# Patient Record
Sex: Male | Born: 1980 | Race: Black or African American | Marital: Single | Smoking: Former smoker
Health system: Northeastern US, Academic
[De-identification: ages and names within clinical notes are randomized; demographics above are authoritative.]

## PROBLEM LIST (undated history)

## (undated) DIAGNOSIS — K529 Noninfective gastroenteritis and colitis, unspecified: Secondary | ICD-10-CM

## (undated) DIAGNOSIS — I10 Essential (primary) hypertension: Secondary | ICD-10-CM

## (undated) HISTORY — PX: OTHER SURGICAL HISTORY: SHX169

## (undated) HISTORY — PX: SHOULDER SURGERY: SHX246

---

## 2010-10-24 ENCOUNTER — Encounter: Payer: Self-pay | Admitting: Emergency Medicine

## 2010-10-24 ENCOUNTER — Emergency Department
Admission: EM | Admit: 2010-10-24 | Disposition: A | Discharge status: Home / Self Care | Payer: Self-pay | Source: Ambulatory Visit

## 2010-10-24 LAB — CBC AND DIFFERENTIAL
Baso # K/uL: 0.1 THOU/uL (ref 0.0–0.1)
Basophil %: 0.5 % (ref 0.2–1.2)
Eos # K/uL: 0.1 THOU/uL (ref 0.0–0.5)
Eosinophil %: 1.1 % (ref 0.8–7.0)
Hematocrit: 45 % (ref 40–51)
Hemoglobin: 15.1 g/dL (ref 13.7–17.5)
Lymph # K/uL: 2.5 THOU/uL (ref 1.3–3.6)
Lymphocyte %: 22.9 % (ref 21.8–53.1)
MCV: 86 fL (ref 79–92)
Mono # K/uL: 0.8 THOU/uL (ref 0.3–0.8)
Monocyte %: 7.1 % (ref 5.3–12.2)
Neut # K/uL: 7.5 THOU/uL — ABNORMAL HIGH (ref 1.8–5.4)
Platelets: 337 THOU/uL — ABNORMAL HIGH (ref 150–330)
RBC: 5.2 MIL/uL (ref 4.6–6.1)
RDW: 13.4 % (ref 11.6–14.4)
Seg Neut %: 68.4 % — ABNORMAL HIGH (ref 34.0–67.9)
WBC: 11 THOU/uL — ABNORMAL HIGH (ref 4.2–9.1)

## 2010-10-24 LAB — POCT URINALYSIS DIPSTICK
Blood,UA: NEGATIVE
Exp date: 2012
Glucose,UA: NORMAL
Ketones, UA: NEGATIVE
Leuk Esterase,UA: NEGATIVE
Lot #: 20735101
Nitrite,UA POCT: NEGATIVE
PH,Ur: 5
Protein,UA: NEGATIVE
Specific gravity,UA POCT: 1.02 (ref 1.002–1.030)

## 2010-10-24 LAB — COMPREHENSIVE METABOLIC PANEL
ALT: 31 U/L (ref 0–50)
AST: 26 U/L (ref 0–50)
Albumin: 4.7 g/dL (ref 3.5–5.2)
Alk Phos: 100 U/L (ref 40–130)
Anion Gap: 11 (ref 7–16)
Bilirubin,Total: 0.3 mg/dL (ref 0.0–1.2)
CO2: 27 mmol/L (ref 20–28)
Calcium: 9.4 mg/dL (ref 9.0–10.3)
Chloride: 102 mmol/L (ref 96–108)
Creatinine: 1.11 mg/dL (ref 0.67–1.17)
GFR,Black: >59 *
GFR,Caucasian: >59 *
Glucose: 88 mg/dL (ref 74–106)
Lab: 12 mg/dL (ref 6–20)
Potassium: 4.4 mmol/L (ref 3.3–5.1)
Sodium: 140 mmol/L (ref 133–145)
Total Protein: 7.6 g/dL (ref 6.3–7.7)

## 2010-10-24 MED ORDER — IBUPROFEN 800 MG PO TABS *I*
800.0000 mg | ORAL_TABLET | Freq: Three times a day (TID) | ORAL | Status: AC | PRN
Start: 2010-10-24 — End: 2010-11-03

## 2010-10-24 MED ORDER — KETOROLAC TROMETHAMINE 30 MG/ML IJ SOLN *I*
30.0000 mg | Freq: Once | INTRAMUSCULAR | Status: AC
Start: 2010-10-24 — End: 2010-10-24
  Administered 2010-10-24: 30 mg via INTRAVENOUS
  Filled 2010-10-24: qty 1

## 2010-10-24 NOTE — ED Notes (Signed)
 NOTES ABD. PAIN FOR TWO DAYS.DENIES N/V/D.

## 2010-10-24 NOTE — ED Notes (Signed)
 Pt c/o left abd. Pain x 3 days, denies vomiting. Last BM today and normal.

## 2010-10-24 NOTE — Discharge Instructions (Signed)
Call the medicine clinic and set up an appointment to be seen if your symptoms worsen or to establish a primary care physician.    Return to the emergency department for any worsening symptoms, increased pain, repeated nausea/vomiting, fever/chills, blood in your stool, or with any other concerns.

## 2010-10-24 NOTE — ED Provider Notes (Addendum)
 History   Chief Complaint   Patient presents with   . Abdominal Pain       HPI Comments: 29yo male p/w left sided abdominal pain. Pain started about 2-3 days ago. He remembers doing some heavy lifting at work and thinks the pain started after this. Pain is worse in the morning (8/10 at worst) and worse with movement. Denies nausea/vomiting/fever/chills. He had a normal BM this am with no blood. Pain is non-radiating. Pt. Has had no anorexia- he presented to the ED with boxes of take out from [facility]. At home he has taken tylenol without relief.     Patient is a 29 y.o. male presenting with abdominal pain. The history is provided by the patient.   Abdominal Pain  The primary symptoms of the illness include abdominal pain. The primary symptoms of the illness do not include fever, shortness of breath, nausea, vomiting or diarrhea. The current episode started 2 days ago. The onset of the illness was sudden. The problem has not changed since onset.   The patient has not had a change in bowel habit. Symptoms associated with the illness do not include chills, anorexia (has [employer] take out with him), constipation, urgency, hematuria, frequency or back pain.       History reviewed.  No pertinent past medical history.    Past Surgical History   Procedure Date   . Shoulder surgery      left   . Arm surgery      right         Family History   Problem Relation Age of Onset   . Cancer Mother      breast           reports that he quit smoking about 2 years ago. He does not have any smokeless tobacco history on file.  He reports that he drinks about .5 ounces of alcohol per week.  He reports that he does not currently use illicit drugs.    Review of Systems   Review of Systems   Constitutional: Negative for fever, chills, activity change and appetite change.   HENT: Negative for congestion and neck pain.    Eyes: Negative for photophobia and visual disturbance.   Respiratory: Negative for cough, shortness of breath and  wheezing.    Cardiovascular: Negative for chest pain.   Gastrointestinal: Positive for abdominal pain. Negative for nausea, vomiting, diarrhea, constipation, abdominal distention and anorexia (has [employer] take out with him).   Genitourinary: Negative for urgency, frequency, hematuria and difficulty urinating.   Musculoskeletal: Negative for back pain and gait problem.   Skin: Negative for rash.   Neurological: Negative for dizziness, weakness, light-headedness and headaches.   Psychiatric/Behavioral: Negative for behavioral problems and agitation. The patient is not nervous/anxious.        Physical Exam   BP 133/72  Pulse 67  Temp(Src) 36.1 C (97 F) (Tympanic)  Resp 18  Ht 1.651 m (5\' 5" )  Wt 80.287 kg (177 lb)  BMI 29.45 kg/m2  SpO2 98%    Physical Exam   Constitutional: He is oriented to person, place, and time. He appears well-developed and well-nourished.   HENT:   Head: Normocephalic and atraumatic.   Eyes: Pupils are equal, round, and reactive to light.   Neck: Normal range of motion. Neck supple.   Cardiovascular: Normal rate, normal heart sounds and intact distal pulses.    No murmur heard.  Pulmonary/Chest: Effort normal and breath sounds normal. No respiratory distress.  Abdominal: Soft. Bowel sounds are normal. He exhibits distension (softly). He exhibits no mass. Tenderness is present. He has no rigidity, no rebound, no guarding, no CVA tenderness, no pain at McBurney's point and no Murphy's sign. No hernia.         Musculoskeletal: Normal range of motion.   Neurological: He is alert and oriented to person, place, and time.   Skin: Skin is warm and dry.   Psychiatric: He has a normal mood and affect.       Medical Decision Making   MDM  Number of Diagnoses or Management Options  Diagnosis management comments: Patient seen by me today, 10/24/2010 at the time of arrival 11:10 AM    Assessment:  29 y.o., male comes to the ED with left sided abdominal pain without N/V/D/F/C.  Differential  Diagnosis includes strained muscle, abdominal pain, diverticulitis, kidney stone  Plan: check labs, toradol IV (pain control), check UA    Supervising physician Dr. Evaristo Bury was immediately available.           Amount and/or Complexity of Data Reviewed  Clinical lab tests: ordered          Azucena Fallen, NP    Addendum:    Recent Results (from the past 24 hour(s))   CBC AND DIFFERENTIAL    Collection Time    10/24/10  2:58 PM   Component Value Range   . WBC 11.0 (*) 4.2-9.1 (THOU/uL)   . RBC 5.2  4.6-6.1 (MIL/uL)   . Hemoglobin 15.1  13.7-17.5 (g/dL)   . Hematocrit 45  40-51 (%)   . MCV 86  79-92 (fL)   . RDW 13.4  11.6-14.4 (%)   . Platelets 337 (*) 150-330 (THOU/uL)   . Seg Neut % 68.4 (*) 34.0-67.9 (%)   . Lymphocyte % 22.9  21.8-53.1 (%)   . Monocyte % 7.1  5.3-12.2 (%)   . Eosinophil % 1.1  0.8-7.0 (%)   . Basophil % 0.5  0.2-1.2 (%)   . Neut # K/uL 7.5 (*) 1.8-5.4 (THOU/uL)   . Lymph # K/uL 2.5  1.3-3.6 (THOU/uL)   . Mono # K/uL 0.8  0.3-0.8 (THOU/uL)   . Eos # K/uL 0.1  0.0-0.5 (THOU/uL)   . Baso # K/uL 0.1  0.0-0.1 (THOU/uL)   COMPREHENSIVE METABOLIC PANEL    Collection Time    10/24/10  2:58 PM   Component Value Range   . Sodium 140  133-145 (mmol/L)   . Potassium 4.4  3.3-5.1 (mmol/L)   . Chloride 102  96-108 (mmol/L)   . CO2 27  20-28 (mmol/L)   . Anion Gap 11  7-16    . UN 12  6-20 (mg/dL)   . Creatinine 1.11  0.67-1.17 (mg/dL)   . GFR,Caucasian > 59  (*)   . GFR,Black > 59  (*)   . Glucose 88  74-106 (mg/dL)   . Calcium 9.4  9.0-10.3 (mg/dL)   . Total Protein 7.6  6.3-7.7 (g/dL)   . Albumin 4.7  3.5-5.2 (g/dL)   . Bilirubin, Total 0.3  0.0-1.2 (mg/dL)   . AST 26  0-50 (U/L)   . ALT 31  0-50 (U/L)   . Alk Phos 100  40-130 (U/L)   POCT URINALYSIS DIPSTICK    Collection Time    10/24/10  2:59 PM   Component Value Range   . Specific gravity, UA 1.020  1.002 - 1.030    . PH, URINE 5.0     . Leuk  Esterase,UA Negative     . Nitrite, UA Negative (no pink)   > Negative (no pink)    . Protein, UA Negative     .  Glucose,UA Normal     . Ketones, UA Negative     . Urobilinogen, UA       . Bilirubin, UA     > Negative    . Blood, UA Negative     . Exp date 2012     . Lot # [id number]         Labs and UA- negative.  Pain lessened after IV toradol.  Will send home with prescription for Motrin 800mg .    Azucena Fallen, NP

## 2010-12-18 ENCOUNTER — Other Ambulatory Visit: Payer: Self-pay | Admitting: Gastroenterology

## 2010-12-18 ENCOUNTER — Emergency Department
Admission: EM | Admit: 2010-12-18 | Discharge status: Against Medical Advice | Payer: Self-pay | Source: Ambulatory Visit

## 2010-12-18 NOTE — ED Notes (Signed)
Pt called x 3 no answer 

## 2010-12-18 NOTE — ED Notes (Signed)
 Chest wall pain since last pm. Hx same last week dx with muscular pain. Denies injury, drug use. Pain increases with movement

## 2010-12-24 LAB — EKG 12-LEAD
P: -53 degrees
QRS: -54 degrees
Rate: 69 {beats}/min
Severity: ABNORMAL
Severity: ABNORMAL
Statement: ABNORMAL
T: -67 degrees

## 2012-07-28 ENCOUNTER — Ambulatory Visit
Admit: 2012-07-28 | Discharge: 2012-07-28 | Disposition: A | Discharge status: Home / Self Care | Payer: Self-pay | Source: Ambulatory Visit | Attending: Emergency Medicine | Admitting: Emergency Medicine

## 2012-09-29 ENCOUNTER — Ambulatory Visit
Admit: 2012-09-29 | Discharge: 2012-09-29 | Disposition: A | Discharge status: Home / Self Care | Payer: Self-pay | Source: Ambulatory Visit | Attending: Emergency Medicine | Admitting: Emergency Medicine

## 2012-10-29 ENCOUNTER — Ambulatory Visit
Admit: 2012-10-29 | Discharge: 2012-10-29 | Disposition: A | Discharge status: Home / Self Care | Payer: Self-pay | Source: Ambulatory Visit | Attending: Emergency Medicine | Admitting: Emergency Medicine

## 2012-11-08 ENCOUNTER — Ambulatory Visit
Admit: 2012-11-08 | Discharge: 2012-11-08 | Disposition: A | Discharge status: Home / Self Care | Payer: Self-pay | Source: Ambulatory Visit | Attending: Emergency Medicine | Admitting: Emergency Medicine

## 2013-01-26 ENCOUNTER — Ambulatory Visit
Admit: 2013-01-26 | Discharge: 2013-01-26 | Disposition: A | Discharge status: Home / Self Care | Payer: Self-pay | Source: Ambulatory Visit | Attending: Emergency Medicine | Admitting: Emergency Medicine

## 2013-02-08 ENCOUNTER — Ambulatory Visit
Admit: 2013-02-08 | Discharge: 2013-02-08 | Disposition: A | Discharge status: Home / Self Care | Payer: Self-pay | Source: Ambulatory Visit | Attending: Emergency Medicine | Admitting: Emergency Medicine

## 2013-03-17 ENCOUNTER — Ambulatory Visit
Admit: 2013-03-17 | Discharge: 2013-03-17 | Disposition: A | Discharge status: Home / Self Care | Payer: Self-pay | Source: Ambulatory Visit | Attending: Emergency Medicine | Admitting: Emergency Medicine

## 2013-05-03 ENCOUNTER — Ambulatory Visit
Admit: 2013-05-03 | Discharge: 2013-05-03 | Disposition: A | Discharge status: Home / Self Care | Payer: Self-pay | Source: Ambulatory Visit | Attending: Emergency Medicine | Admitting: Emergency Medicine

## 2015-04-23 ENCOUNTER — Emergency Department: Admit: 2015-04-23 | Payer: Self-pay | Source: Ambulatory Visit

## 2015-04-23 ENCOUNTER — Emergency Department
Admission: EM | Admit: 2015-04-23 | Disposition: A | Discharge status: Home / Self Care | Payer: Self-pay | Source: Ambulatory Visit | Attending: Emergency Medicine | Admitting: Emergency Medicine

## 2015-04-23 ENCOUNTER — Encounter: Payer: Self-pay | Admitting: Emergency Medicine

## 2015-04-23 MED ORDER — OXYCODONE-ACETAMINOPHEN 5-325 MG PO TABS *I*
2.0000 | ORAL_TABLET | Freq: Once | ORAL | Status: AC
Start: 2015-04-23 — End: 2015-04-23
  Administered 2015-04-23: 2 via ORAL
  Filled 2015-04-23: qty 2

## 2015-04-23 MED ORDER — OXYCODONE-ACETAMINOPHEN 5-325 MG PO TABS *I*
2.0000 | ORAL_TABLET | Freq: Once | ORAL | Status: DC
Start: 2015-04-23 — End: 2015-04-23

## 2015-04-23 MED ORDER — OXYCODONE-ACETAMINOPHEN 5-325 MG PO TABS *I*
1.0000 | ORAL_TABLET | ORAL | 0 refills | Status: DC | PRN
Start: 2015-04-23 — End: 2017-12-13

## 2015-04-23 MED ORDER — PENICILLIN V POTASSIUM 500 MG PO TABS *I*
500.0000 mg | ORAL_TABLET | Freq: Four times a day (QID) | ORAL | 0 refills | Status: AC
Start: 2015-04-23 — End: 2015-04-30

## 2015-04-23 MED ORDER — PENICILLIN V POTASSIUM 500 MG PO TABS *I*
500.0000 mg | ORAL_TABLET | Freq: Once | ORAL | Status: AC
Start: 2015-04-23 — End: 2015-04-23
  Administered 2015-04-23: 500 mg via ORAL
  Filled 2015-04-23: qty 1

## 2015-04-23 NOTE — ED Notes (Signed)
Ready fjor discharge. No iv.  Safe with ambulation.  Reviewed eprescriptions for penicillin and percocet and verified pharmacy location.  Advised not to drink alcohol,ldrive or take tylenol with the percoet.  Will follow up with max/facial asd dierected on instruction sheet.

## 2015-04-23 NOTE — Discharge Instructions (Signed)
You presented to the Emergency Department (ED) with complaints of dental pain and swelling    Your workup here in the ED did not reveal any concerning findings warranting admission to the hospital, further investigation, or workup.    For imaging results, see above under summary of tests and procedures.     Please take your home medications as already prescribed  Take 1-2 tablets of Percocet up to every 4-6 hours as needed for pain. DO NOT DRIVE FOR 4 HOURS AFTER TAKING THIS MEDICATION. This medication contains Tylenol. To avoid possible overdose, do not use tylenol in addition to this medication.   Take 500mg  of Penicillin 4 times daily for 7 days      Follow up with oral surgery as soon as possible for further investigation on this issue. The number is listed above.        Please return to the Emergency Department if you experience severe worsening swelling, trouble swallowing, trouble breathing, or for any other emergency.    Thank you for choosing [facility] for your healthcare needs.

## 2015-04-23 NOTE — ED Triage Notes (Signed)
Triage Note   Dental pain / waited at [facility] dental x 4 hours and came here     Charlett Lango, RN

## 2015-04-23 NOTE — ED Provider Notes (Addendum)
History     Chief Complaint   Patient presents with    Dental Pain       HPI Comments: 34yo male with no sig PMH    Presents to ED with 2 days L sided maxillary dental pain and 1 day swelling of cheek. Pain with opening mouth. No trouble swallowing or breathing. No ear pain. Denies fevers/chills. L maxillary molar #1 and #3 were fractured upon attempted extraction 1 month ago at dental office- referred to oral surgeon but has not followed up yet. Pain 10/10 severity    Was at [facility], but left due to excessive wait and came here.       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 No pertinent past medical history.         Past Surgical History   Procedure Laterality Date    Shoulder surgery       left    Arm surgery       right       Family History   Problem Relation Age of Onset    Cancer Mother      breast          Social History      reports that he quit smoking about 6 years ago. He does not have any smokeless tobacco history on file. He reports that he drinks about 0.5 oz of alcohol per week  He reports that he does not use illicit drugs. His sexual activity history is not on file.    Living Situation     Questions Responses    Patient lives with Alone    Homeless No    Caregiver for other family member No    External Services None    Employment Employed    Domestic Violence Risk           Problem List     There is no problem list on file for this patient.      Review of Systems   Review of Systems   Constitutional: Negative for chills and fever.   HENT: Positive for dental problem and facial swelling. Negative for congestion, drooling, ear discharge, ear pain, rhinorrhea, tinnitus, trouble swallowing and voice change.    Eyes: Negative for visual disturbance.   Respiratory: Negative for cough and shortness of breath.    Gastrointestinal: Negative for abdominal  pain, nausea and vomiting.   Genitourinary: Negative for flank pain.   Musculoskeletal: Negative for back pain, neck pain and neck stiffness.   Skin: Negative for rash.   Neurological: Negative for dizziness, weakness, light-headedness and headaches.   Psychiatric/Behavioral: Negative for behavioral problems and confusion.       Physical Exam     ED Triage Vitals   BP Heart Rate Heart Rate (via Pulse Ox) Resp Temp Temp src SpO2 O2 Device O2 Flow Rate   04/23/15 1456 04/23/15 1456 -- 04/23/15 1456 04/23/15 1456 04/23/15 1456 04/23/15 1456 04/23/15 1614 --   126/82 70  17 37 C (98.6 F) TEMPORAL 99 % None (Room air)       Weight           04/23/15 1456           88.5 kg (195 lb)  Physical Exam   Constitutional: He is oriented to person, place, and time. He appears well-developed and well-nourished. No distress.   HENT:   Head: Normocephalic and atraumatic.   Mouth/Throat:       Eyes: Conjunctivae are normal.   Neck: Neck supple.   Cardiovascular: Normal rate, regular rhythm and intact distal pulses.    Pulmonary/Chest: Effort normal. No respiratory distress.   Abdominal: Soft.   Neurological: He is alert and oriented to person, place, and time.   Skin: Skin is warm and dry. He is not diaphoretic. No pallor.   Psychiatric: He has a normal mood and affect. His behavior is normal.       Medical Decision Making      Amount and/or Complexity of Data Reviewed  Tests in the radiology section of CPT: ordered and reviewed  Review and summarize past medical records: yes        Initial Evaluation:  ED First Provider Contact     Date/Time Event User Comments    04/23/15 1455 ED Provider First Contact Katherene Ponto E Initial Face to Face Provider Contact          Patient seen by me today 04/23/2015 at 1630    Assessment:  34 y.o., male comes to the ED with dental pain and swelling to left maxillary molar #3. S/p failed extraction 1 month ago and referred to oral surgery- has not followed up. Mild swelling.          Differential Diagnosis includes    erosive pulpitis   possible dental infection, no palpable or visible abscess   no signs of Lemierre's or Ludwig's angina on exam       Plan:   panorex  PO percocet and Penicillin VK  D/c home with referral to oral surgery.     Marice Potter, MD      Resident Attestation:     Patient seen by me today, 04/23/2015 at 1635    History:   I reviewed this patient, reviewed the resident's note and agree.  Exam:   I examined this patient, reviewed the resident's note and agree.    Decision Making:   I discussed with the resident his/her documented decision making  and agree.      34 y/o M with known dental fracture due for extraction but lost the number to arrange follow up presents with 2 days of left upper molar pain and facial swelling since last night. No fevers/chills. No trismus on exam or obvious abscess. TTP over the gums. Will start Pen VK, oral analgesia, refer to oral surgery for definitive therapy.    Thereasa Solo, MD       Marice Potter, MD  04/23/15 1737       Concha Se, MD  04/23/15 Paulo Fruit

## 2015-04-23 NOTE — First Provider Contact (Signed)
ED Medical Screening Exam Note    Initial provider evaluation performed by   ED First Provider Contact     Date/Time Event User Comments    04/23/15 1455 ED Provider First Contact Quanta Roher, Marylene Land E Initial Face to Face Provider Contact      Left facial swelling pain for one day, upper dental pain.    Vital signs reviewed.    Orders placed:  XRAYS and ANALGESIA     Patient requires further evaluation.     Basilia Jumbo, NP, 04/23/2015, 2:55 PM    Supervising physician Lynnae Prude was immediately available     Basilia Jumbo, NP  04/23/15 1456

## 2015-10-12 ENCOUNTER — Emergency Department (HOSPITAL_COMMUNITY): Payer: Medicaid - Out of State

## 2015-10-12 ENCOUNTER — Emergency Department (HOSPITAL_COMMUNITY)
Admission: EM | Admit: 2015-10-12 | Discharge: 2015-10-12 | Disposition: A | Discharge status: Home / Self Care | Payer: Medicaid - Out of State | Attending: Emergency Medicine | Admitting: Emergency Medicine

## 2015-10-12 ENCOUNTER — Encounter (HOSPITAL_COMMUNITY): Payer: Self-pay | Admitting: *Deleted

## 2015-10-12 DIAGNOSIS — F17210 Nicotine dependence, cigarettes, uncomplicated: Secondary | ICD-10-CM | POA: Diagnosis not present

## 2015-10-12 DIAGNOSIS — R1110 Vomiting, unspecified: Secondary | ICD-10-CM | POA: Insufficient documentation

## 2015-10-12 DIAGNOSIS — R197 Diarrhea, unspecified: Secondary | ICD-10-CM | POA: Insufficient documentation

## 2015-10-12 DIAGNOSIS — R079 Chest pain, unspecified: Secondary | ICD-10-CM | POA: Insufficient documentation

## 2015-10-12 DIAGNOSIS — Z8719 Personal history of other diseases of the digestive system: Secondary | ICD-10-CM | POA: Diagnosis not present

## 2015-10-12 DIAGNOSIS — R109 Unspecified abdominal pain: Secondary | ICD-10-CM | POA: Diagnosis not present

## 2015-10-12 HISTORY — DX: Noninfective gastroenteritis and colitis, unspecified: K52.9

## 2015-10-12 LAB — CBC
HCT: 45.8 % (ref 39.0–52.0)
HEMOGLOBIN: 15.4 g/dL (ref 13.0–17.0)
MCH: 28.7 pg (ref 26.0–34.0)
MCHC: 33.6 g/dL (ref 30.0–36.0)
MCV: 85.4 fL (ref 78.0–100.0)
PLATELETS: 319 10*3/uL (ref 150–400)
RBC: 5.36 MIL/uL (ref 4.22–5.81)
RDW: 14.2 % (ref 11.5–15.5)
WBC: 8.8 10*3/uL (ref 4.0–10.5)

## 2015-10-12 LAB — COMPREHENSIVE METABOLIC PANEL
ALT: 25 U/L (ref 17–63)
ANION GAP: 10 (ref 5–15)
AST: 33 U/L (ref 15–41)
Albumin: 4.2 g/dL (ref 3.5–5.0)
Alkaline Phosphatase: 89 U/L (ref 38–126)
BILIRUBIN TOTAL: 0.9 mg/dL (ref 0.3–1.2)
BUN: 10 mg/dL (ref 6–20)
CHLORIDE: 103 mmol/L (ref 101–111)
CO2: 26 mmol/L (ref 22–32)
Calcium: 9.6 mg/dL (ref 8.9–10.3)
Creatinine, Ser: 1.19 mg/dL (ref 0.61–1.24)
GFR calc Af Amer: >60 mL/min (ref >60)
Glucose, Bld: 118 mg/dL — ABNORMAL HIGH (ref 65–99)
POTASSIUM: 3.7 mmol/L (ref 3.5–5.1)
Sodium: 139 mmol/L (ref 135–145)
TOTAL PROTEIN: 7.3 g/dL (ref 6.5–8.1)

## 2015-10-12 LAB — URINALYSIS, ROUTINE W REFLEX MICROSCOPIC
Glucose, UA: NEGATIVE mg/dL
HGB URINE DIPSTICK: NEGATIVE
Ketones, ur: 15 mg/dL — AB
LEUKOCYTES UA: NEGATIVE
NITRITE: NEGATIVE
PROTEIN: NEGATIVE mg/dL
SPECIFIC GRAVITY, URINE: 1.035 — AB (ref 1.005–1.030)
pH: 6 (ref 5.0–8.0)

## 2015-10-12 LAB — I-STAT TROPONIN, ED
TROPONIN I, POC: 0 ng/mL (ref 0.00–0.08)
TROPONIN I, POC: 0 ng/mL (ref 0.00–0.08)

## 2015-10-12 MED ORDER — NAPROXEN 500 MG PO TABS: 500.0000 mg | ORAL_TABLET | Freq: Two times a day (BID) | ORAL | Status: AC

## 2015-10-12 MED ORDER — KETOROLAC TROMETHAMINE 30 MG/ML IJ SOLN
30.0000 mg | Freq: Once | INTRAMUSCULAR | Status: AC
  Administered 2015-10-12: 30 mg via INTRAVENOUS
  Filled 2015-10-12: qty 1

## 2015-10-12 MED ORDER — DICYCLOMINE HCL 20 MG PO TABS: 20.0000 mg | ORAL_TABLET | Freq: Two times a day (BID) | ORAL | Status: AC

## 2015-10-12 NOTE — ED Notes (Signed)
Pt reports left side chest pain x 2 days. Having recent productive cough with yellow sputum. Also reports having hx of colitis and has LLQ pain for extended amount of time. Reports n/v x 1 last night, recent diarrhea, denies blood in stool.

## 2015-10-12 NOTE — ED Provider Notes (Signed)
CSN: [id number]     Arrival date & time 10/12/15  1255 History   First MD Initiated Contact with Patient 10/12/15 1645     Chief Complaint  Patient presents with  . Chest Pain  . Abdominal Pain   Patient is a 34 y.o. male presenting with chest pain and abdominal pain. The history is provided by the patient.  Chest Pain Pain location:  L chest Pain quality: sharp   Pain radiates to:  Does not radiate Pain severity:  Moderate Onset quality:  Gradual Duration:  2 days Timing:  Intermittent Progression:  Worsening Context: breathing   Relieved by:  Nothing Worsened by:  Deep breathing Associated symptoms: abdominal pain and vomiting   Associated symptoms: no fever and no weakness   Abdominal Pain Associated symptoms: chest pain, diarrhea and vomiting   Associated symptoms: no fever   Maybe 3-4 episodes of vomiting and diarrhea.  He has a history of colitis and this is flaring up.  He has pain on the left abdomen too.  No fever No hx of DVT.  No PE.  Past Medical History  Diagnosis Date  . Colitis    History reviewed. No pertinent past surgical history. History reviewed. No pertinent family history. Social History  Substance Use Topics  . Smoking status: Current Every Day Smoker    Types: Cigarettes  . Smokeless tobacco: None  . Alcohol Use: Yes     Comment: occ    Review of Systems  Constitutional: Negative for fever.  Cardiovascular: Positive for chest pain.  Gastrointestinal: Positive for vomiting, abdominal pain and diarrhea.  Neurological: Negative for weakness.  All other systems reviewed and are negative.     Allergies  Review of patient's allergies indicates no known allergies.  Home Medications   Prior to Admission medications   Not on File   BP 138/96 mmHg  Pulse 76  Temp(Src) 97.8 F (36.6 C) (Oral)  Resp 16  SpO2 98% Physical Exam  Constitutional: He appears well-developed and well-nourished. No distress.  HENT:  Head: Normocephalic and  atraumatic.  Right Ear: External ear normal.  Left Ear: External ear normal.  Eyes: Conjunctivae are normal. Right eye exhibits no discharge. Left eye exhibits no discharge. No scleral icterus.  Neck: Neck supple. No tracheal deviation present.  Cardiovascular: Normal rate, regular rhythm and intact distal pulses.   Pulmonary/Chest: Effort normal and breath sounds normal. No stridor. No respiratory distress. He has no wheezes. He has no rales.  Abdominal: Soft. Bowel sounds are normal. He exhibits no distension. There is no tenderness. There is no rebound and no guarding.  Musculoskeletal: He exhibits no edema or tenderness.  Neurological: He is alert. He has normal strength. No cranial nerve deficit (no facial droop, extraocular movements intact, no slurred speech) or sensory deficit. He exhibits normal muscle tone. He displays no seizure activity. Coordination normal.  Skin: Skin is warm and dry. No rash noted.  Psychiatric: He has a normal mood and affect.  Nursing note and vitals reviewed.   ED Course  Procedures (including critical care time) Labs Review Labs Reviewed  COMPREHENSIVE METABOLIC PANEL - Abnormal; Notable for the following:    Glucose, Bld 118 (*)    All other components within normal limits  URINALYSIS, ROUTINE W REFLEX MICROSCOPIC (NOT AT [facility]) - Abnormal; Notable for the following:    Color, Urine AMBER (*)    Specific Gravity, Urine 1.035 (*)    Bilirubin Urine SMALL (*)    Ketones, ur 15 (*)  All other components within normal limits  CBC  I-STAT TROPOININ, ED  I-STAT TROPOININ, ED    Imaging Review Dg Chest 2 View  10/12/2015  CLINICAL DATA:  LEFT chest pain when taking a deep breath. EXAM: CHEST  2 VIEW COMPARISON:  None. FINDINGS: The heart size and mediastinal contours are within normal limits. Both lungs are clear. The visualized skeletal structures are unremarkable. IMPRESSION: No active cardiopulmonary disease. Electronically Signed   By: Elsie Stain M.D.   On: 10/12/2015 13:33   I have personally reviewed and evaluated these images and lab results as part of my medical decision-making.   EKG Interpretation   Date/Time:  Friday October 12 2015 12:59:57 EST Ventricular Rate:  80 PR Interval:  126 QRS Duration: 70 QT Interval:  376 QTC Calculation: 433 R Axis:   68 Text Interpretation:  Normal sinus rhythm with sinus arrhythmia ST \\T \ T  wave abnormality, consider anterolateral ischemia Abnormal ECG No old  tracing to compare Confirmed by Alix Lahmann  MD-J, Earlie Schank ([id number]) on 10/12/2015  5:07:27 PM      MDM   Final diagnoses:  Abdominal pain, unspecified abdominal location  Chest pain, unspecified chest pain type    Patient's symptoms started 2 days ago. They are atypical for acute coronary syndrome. I doubt coronary artery disease. He has no risk factors for pulmonary embolism. Perc negative.    Abdominal exam is benign. He has had some episodes of vomiting and diarrhea. I doubt an acute surgical or emergency condition.  At this time there does not appear to be any evidence of an acute emergency medical condition and the patient appears stable for discharge with appropriate outpatient follow up.     Linwood Dibbles, MD 10/12/15 [phone]

## 2015-10-12 NOTE — Discharge Instructions (Signed)
Abdominal Pain, Adult °Many things can cause abdominal pain. Usually, abdominal pain is not caused by a disease and will improve without treatment. It can often be observed and treated at home. Your health care provider will do a physical exam and possibly order blood tests and X-rays to help determine the seriousness of your pain. However, in many cases, more time must pass before a clear cause of the pain can be found. Before that point, your health care provider may not know if you need more testing or further treatment. °HOME CARE INSTRUCTIONS °Monitor your abdominal pain for any changes. The following actions may help to alleviate any discomfort you are experiencing: °· Only take over-the-counter or prescription medicines as directed by your health care provider. °· Do not take laxatives unless directed to do so by your health care provider. °· Try a clear liquid diet (broth, tea, or water) as directed by your health care provider. Slowly move to a bland diet as tolerated. °SEEK MEDICAL CARE IF: °· You have unexplained abdominal pain. °· You have abdominal pain associated with nausea or diarrhea. °· You have pain when you urinate or have a bowel movement. °· You experience abdominal pain that wakes you in the night. °· You have abdominal pain that is worsened or improved by eating food. °· You have abdominal pain that is worsened with eating fatty foods. °· You have a fever. °SEEK IMMEDIATE MEDICAL CARE IF: °· Your pain does not go away within 2 hours. °· You keep throwing up (vomiting). °· Your pain is felt only in portions of the abdomen, such as the right side or the left lower portion of the abdomen. °· You pass bloody or black tarry stools. °MAKE SURE YOU: °· Understand these instructions. °· Will watch your condition. °· Will get help right away if you are not doing well or get worse. °  °This information is not intended to replace advice given to you by your health care provider. Make sure you discuss  any questions you have with your health care provider. °  °Document Released: 07/30/2005 Document Revised: 07/11/2015 Document Reviewed: 06/29/2013 °Elsevier Interactive Patient Education ©2016 [employer] ° °Nonspecific Chest Pain °It is often hard to find the cause of chest pain. There is always a chance that your pain could be related to something serious, such as a heart attack or a blood clot in your lungs. Chest pain can also be caused by conditions that are not life-threatening. If you have chest pain, it is very important to follow up with your doctor. ° °HOME CARE °· If you were prescribed an antibiotic medicine, finish it all even if you start to feel better. °· Avoid any activities that cause chest pain. °· Do not use any tobacco products, including cigarettes, chewing tobacco, or electronic cigarettes. If you need help quitting, ask your doctor. °· Do not drink alcohol. °· Take medicines only as told by your doctor. °· Keep all follow-up visits as told by your doctor. This is important. This includes any further testing if your chest pain does not go away. °· Your doctor may tell you to keep your head raised (elevated) while you sleep. °· Make lifestyle changes as told by your doctor. These may include: °¨ Getting regular exercise. Ask your doctor to suggest some activities that are safe for you. °¨ Eating a heart-healthy diet. Your doctor or a diet specialist (dietitian) can help you to learn healthy eating options. °¨ Maintaining a healthy weight. °¨ Managing   diabetes, if necessary. °¨ Reducing stress. °GET HELP IF: °· Your chest pain does not go away, even after treatment. °· You have a rash with blisters on your chest. °· You have a fever. °GET HELP RIGHT AWAY IF: °· Your chest pain is worse. °· You have an increasing cough, or you cough up blood. °· You have severe belly (abdominal) pain. °· You feel extremely weak. °· You pass out (faint). °· You have chills. °· You have sudden, unexplained chest  discomfort. °· You have sudden, unexplained discomfort in your arms, back, neck, or jaw. °· You have shortness of breath at any time. °· You suddenly start to sweat, or your skin gets clammy. °· You feel nauseous. °· You vomit. °· You suddenly feel light-headed or dizzy. °· Your heart begins to beat quickly, or it feels like it is skipping beats. °These symptoms may be an emergency. Do not wait to see if the symptoms will go away. Get medical help right away. Call your local emergency services (911 in the [place] Do not drive yourself to the hospital. °  °This information is not intended to replace advice given to you by your health care provider. Make sure you discuss any questions you have with your health care provider. °  °Document Released: 04/07/2008 Document Revised: 11/10/2014 Document Reviewed: 05/26/2014 °Elsevier Interactive Patient Education ©2016 Elsevier Inc. ° °

## 2015-10-12 NOTE — ED Notes (Signed)
Pt verbalized understanding of d/c instructions and has no further questions. Pt stable and NAD.  

## 2017-06-25 IMAGING — DX DG CHEST 2V
2 series · 2 of 2 positions shown · non-contrast
Comparison: None.

CLINICAL DATA: LEFT chest pain when taking a deep breath.

EXAM:
CHEST  2 VIEW

[w chest pa]
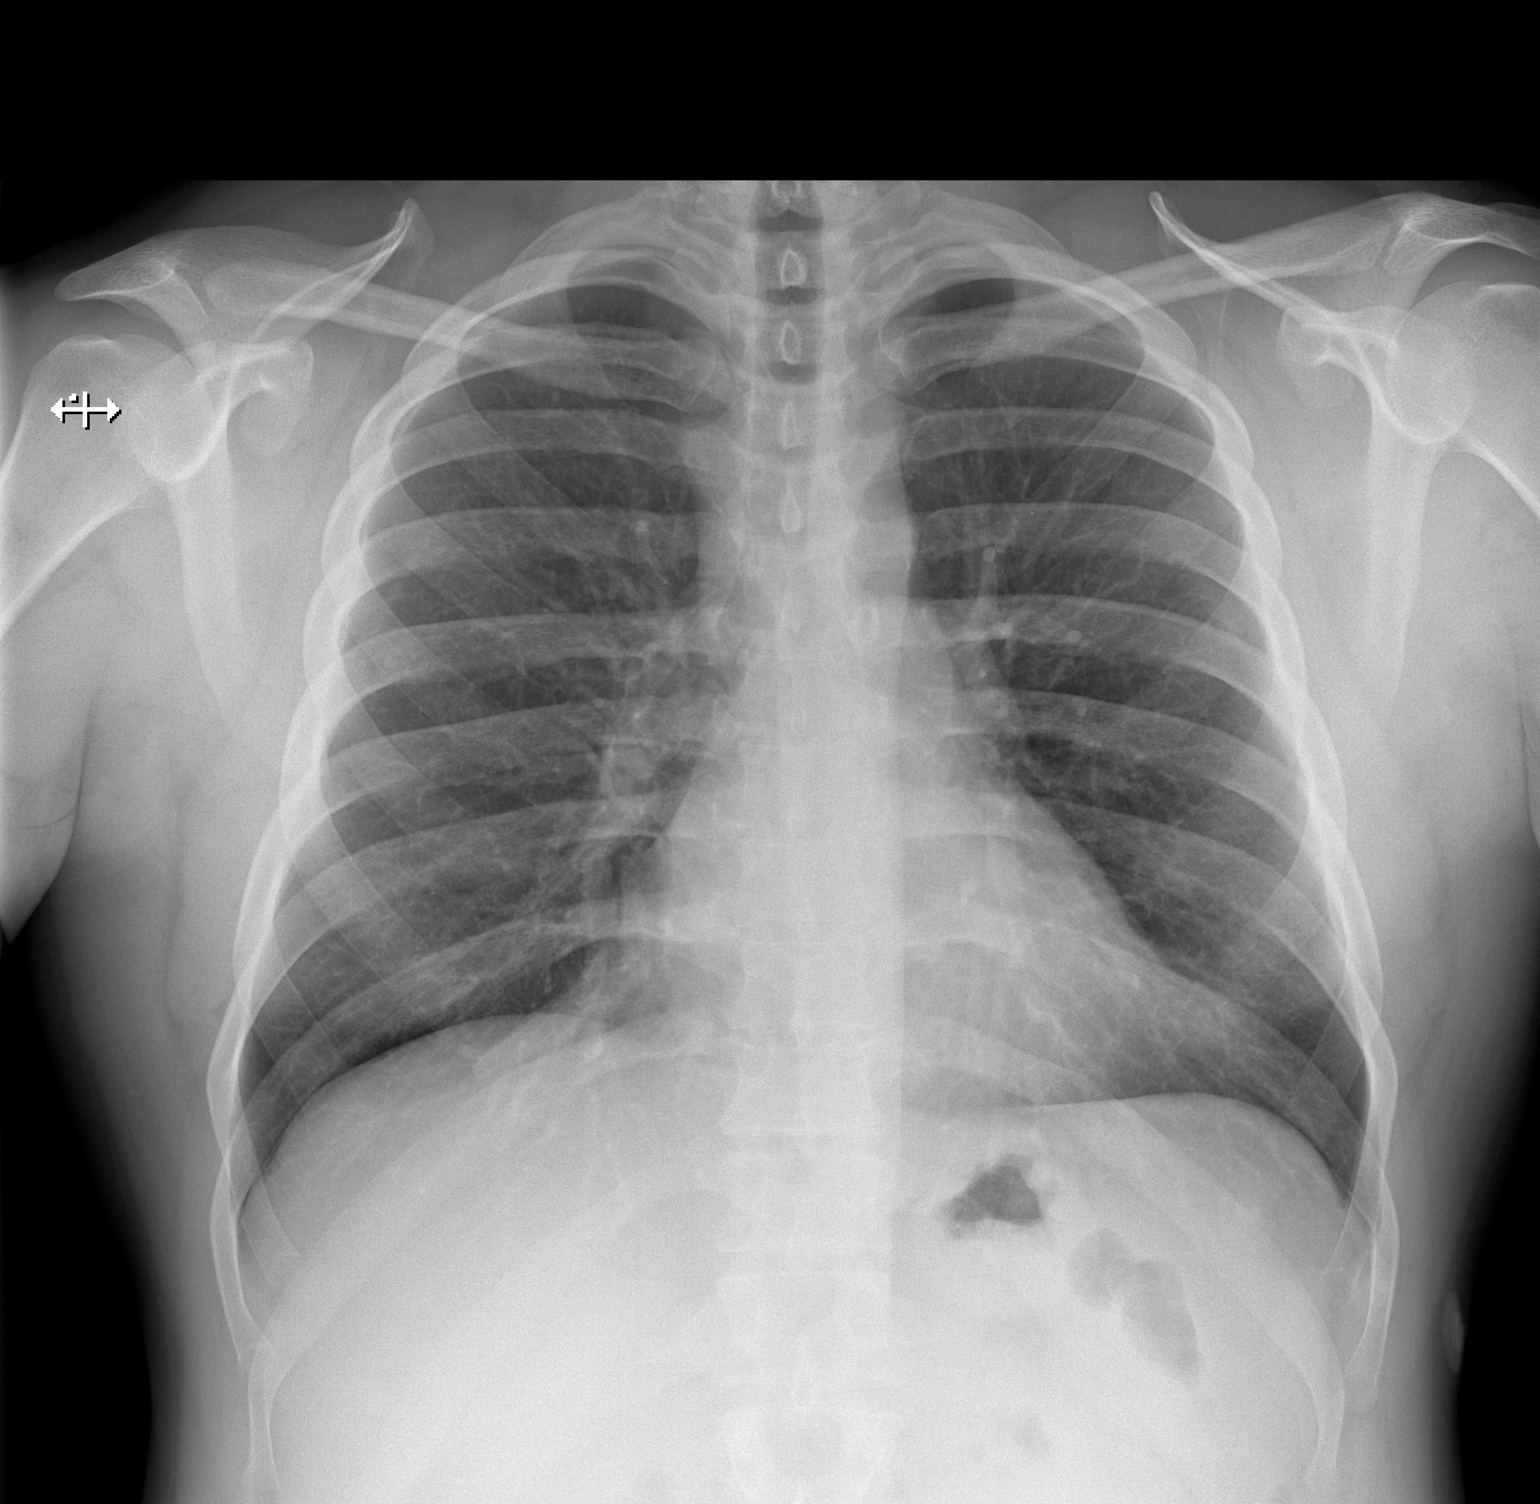

[w chest lat]
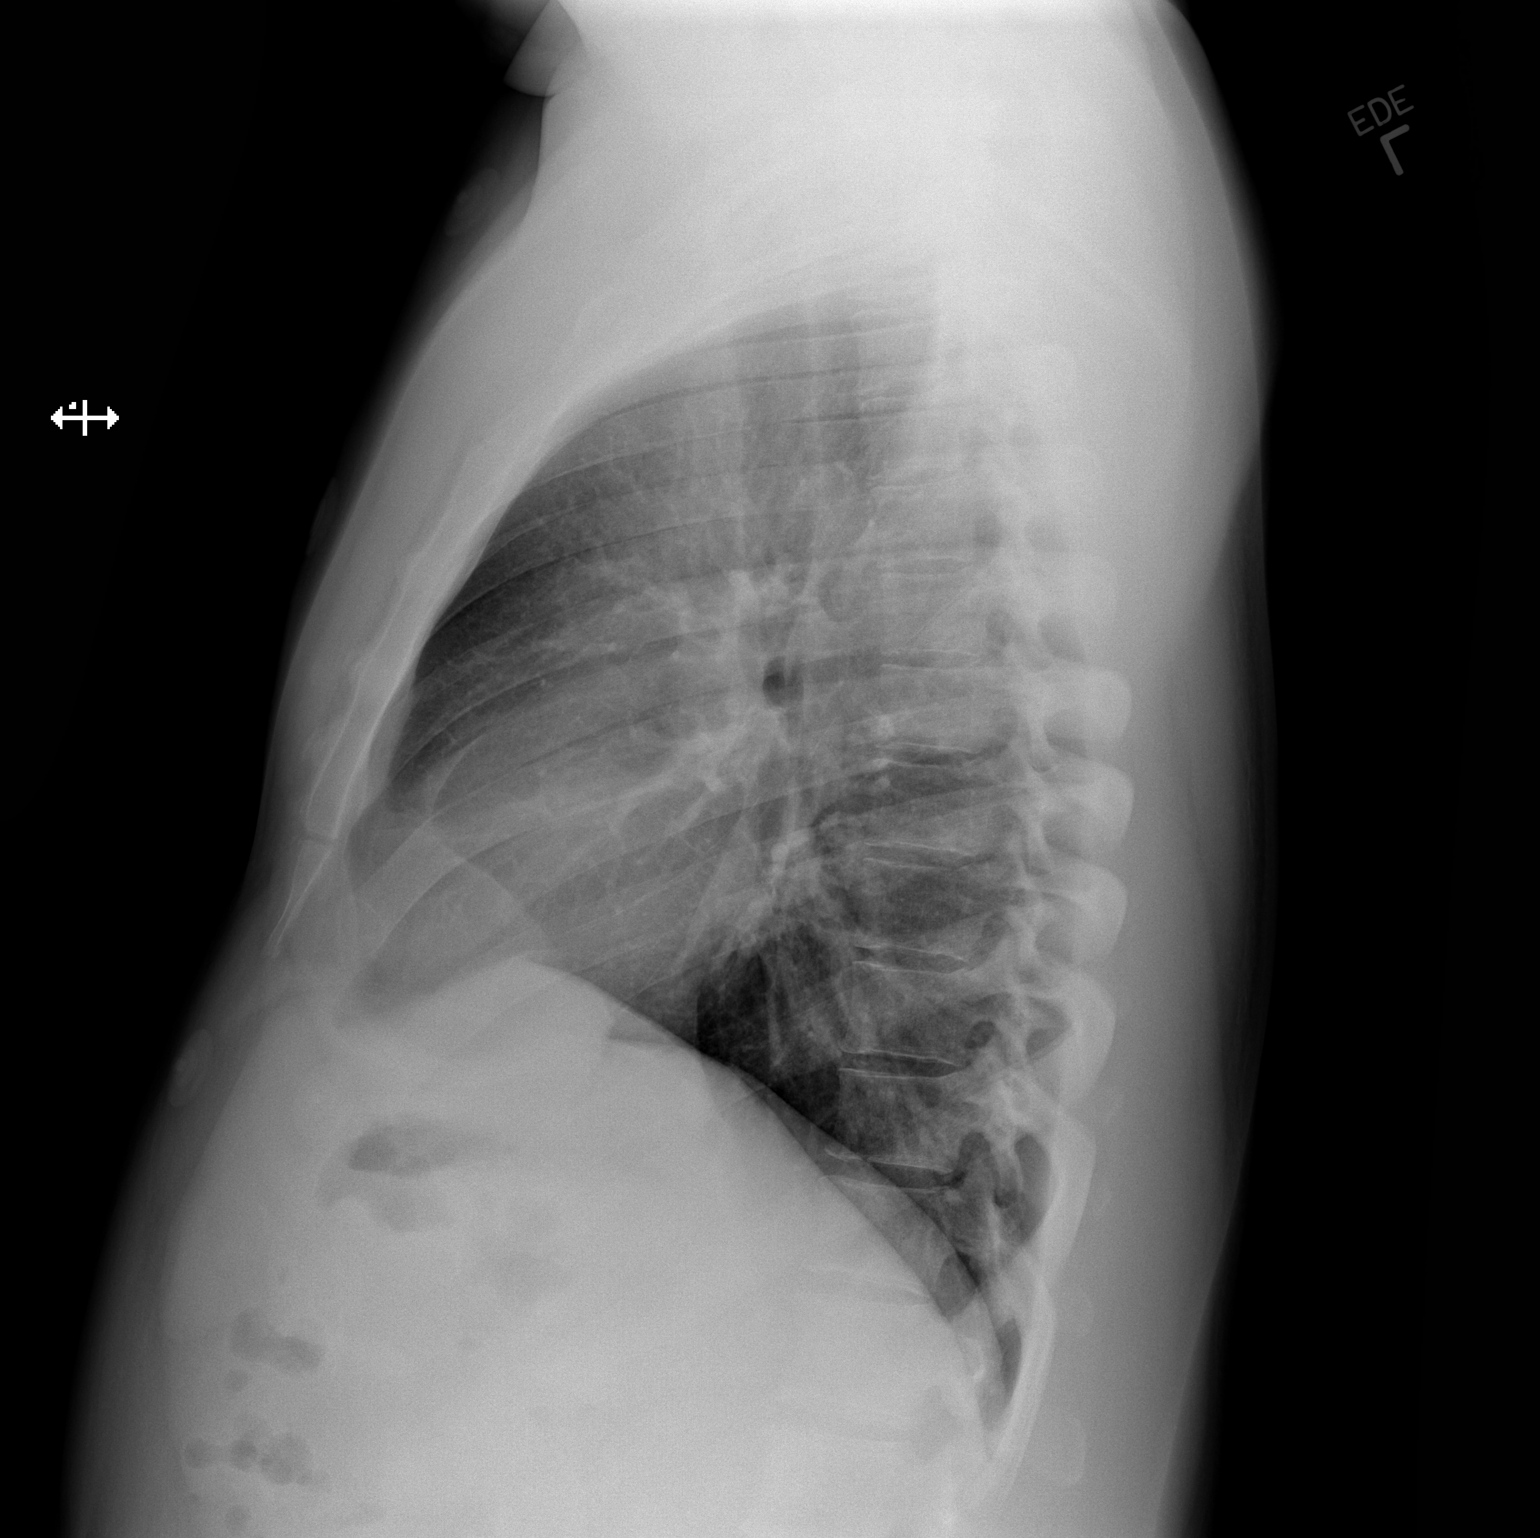

[2 of 2 positions shown; findings below may reference images not displayed]

FINDINGS: The heart size and mediastinal contours are within normal limits.
Both lungs are clear. The visualized skeletal structures are
unremarkable.
IMPRESSION: No active cardiopulmonary disease.

## 2017-12-13 ENCOUNTER — Emergency Department
Admission: EM | Admit: 2017-12-13 | Discharge: 2017-12-13 | Disposition: A | Discharge status: Home / Self Care | Payer: No Typology Code available for payment source | Source: Ambulatory Visit | Attending: Emergency Medicine | Admitting: Emergency Medicine

## 2017-12-13 ENCOUNTER — Encounter: Payer: Self-pay | Admitting: Oncology

## 2017-12-13 DIAGNOSIS — J0100 Acute maxillary sinusitis, unspecified: Secondary | ICD-10-CM | POA: Insufficient documentation

## 2017-12-13 DIAGNOSIS — R0981 Nasal congestion: Secondary | ICD-10-CM

## 2017-12-13 DIAGNOSIS — R05 Cough: Secondary | ICD-10-CM

## 2017-12-13 DIAGNOSIS — K029 Dental caries, unspecified: Secondary | ICD-10-CM | POA: Insufficient documentation

## 2017-12-13 LAB — HM HIV SCREENING OFFERED

## 2017-12-13 MED ORDER — IBUPROFEN 800 MG PO TABS *I*
800.0000 mg | ORAL_TABLET | Freq: Once | ORAL | Status: AC
Start: 2017-12-13 — End: 2017-12-13
  Administered 2017-12-13: 800 mg via ORAL
  Filled 2017-12-13: qty 1

## 2017-12-13 MED ORDER — AMOXICILLIN-POT CLAVULANATE 875-125 MG PO TABS *I*
1.0000 | ORAL_TABLET | Freq: Once | ORAL | Status: AC
Start: 2017-12-13 — End: 2017-12-13
  Administered 2017-12-13: 1 via ORAL
  Filled 2017-12-13: qty 1

## 2017-12-13 MED ORDER — IBUPROFEN 600 MG PO TABS *I*
600.0000 mg | ORAL_TABLET | Freq: Four times a day (QID) | ORAL | 0 refills | Status: AC | PRN
Start: 2017-12-13 — End: 2017-12-18

## 2017-12-13 MED ORDER — ACETAMINOPHEN 325 MG PO TABS *I*
650.0000 mg | ORAL_TABLET | Freq: Once | ORAL | Status: AC
Start: 2017-12-13 — End: 2017-12-13
  Administered 2017-12-13: 650 mg via ORAL
  Filled 2017-12-13: qty 2

## 2017-12-13 MED ORDER — CHLORHEXIDINE GLUCONATE 0.12 % MT SOLN *I*
15.0000 mL | Freq: Two times a day (BID) | OROMUCOSAL | 0 refills | Status: AC
Start: 2017-12-13 — End: ?

## 2017-12-13 MED ORDER — AMOXICILLIN-POT CLAVULANATE 875-125 MG PO TABS *I*
1.0000 | ORAL_TABLET | Freq: Two times a day (BID) | ORAL | 0 refills | Status: AC
Start: 2017-12-13 — End: 2017-12-23

## 2017-12-13 NOTE — ED Provider Notes (Signed)
History     Chief Complaint   Patient presents with    URI    Dental Pain           37 yo male for about one week he has been having a productive cough mostly at night. +production is yellow - green. Every morning he has been coughing up phlegm. He denies fever, no chills, no nausea, no vomiting. + nasal congection,   He also came in because he broke Tooth #31 about 1.5 weeks ago. He has been taking excedrin for the pain--but it is getting worse.             Medical/Surgical/Family History     History reviewed. No pertinent past medical history.     There is no problem list on file for this patient.           Past Surgical History:   Procedure Laterality Date    arm surgery      right    SHOULDER SURGERY      left     Family History   Problem Relation Age of Onset    Cancer Mother         breast           Social History   Substance Use Topics    Smoking status: Former Smoker     Quit date: 10/24/2008    Smokeless tobacco: Never Used    Alcohol use 0.5 oz/week     1 Standard drinks or equivalent per week      Comment: about 1 glass of hennessy per week     Living Situation     Questions Responses    Patient lives with Alone    Homeless No    Caregiver for other family member No    External Services None    Employment Employed    Domestic Violence Risk                 Review of Systems   Review of Systems   Constitutional: Negative for unexpected weight change.   HENT: Positive for congestion.    Eyes: Negative for visual disturbance.   Respiratory: Positive for cough. Negative for shortness of breath.    Cardiovascular: Negative for chest pain.   Gastrointestinal: Negative for abdominal pain.   Genitourinary: Negative for difficulty urinating.   Musculoskeletal: Negative for gait problem.   Skin: Negative for rash.   Neurological: Negative for dizziness.   Psychiatric/Behavioral: The patient is not nervous/anxious.        Physical Exam     Triage Vitals  Triage Start: Start, (12/13/17 0908)   First  Recorded BP: 143/83, Resp: 18, Temp: 36.5 C (97.7 F), Temp src: TEMPORAL Oxygen Therapy SpO2: 97 %, Oximetry Source: Rt Hand, O2 Device: None (Room air), Heart Rate: 83, (12/13/17 0908)  .      Physical Exam   Constitutional: He is oriented to person, place, and time. He appears well-developed and well-nourished. No distress.   HENT:   Mouth/Throat: Oropharynx is clear and moist. No oropharyngeal exudate.   Eyes: Conjunctivae are normal. Right eye exhibits no discharge. Left eye exhibits no discharge. No scleral icterus.   Cardiovascular: Normal rate, regular rhythm, normal heart sounds and intact distal pulses.    Pulmonary/Chest: Effort normal and breath sounds normal. No respiratory distress. He has no wheezes. He has no rales.   Musculoskeletal: Normal range of motion. He exhibits no edema or tenderness.   Neurological: He  is alert and oriented to person, place, and time. Coordination normal.   Skin: Skin is warm and dry. No rash noted. He is not diaphoretic. No erythema. No pallor.   Psychiatric: He has a normal mood and affect. His behavior is normal. Judgment and thought content normal.   Nursing note and vitals reviewed.      Medical Decision Making        Initial Evaluation:  ED First Provider Contact     Date/Time Event User Comments    12/13/17 [phone] ED First Provider Contact Hannah Beat W Initial Face to Face Provider Contact          Patient seen by me on arrival date of 12/13/2017.    Assessment:  36 y.o.male comes to the ED with toothache. This is a 37 yo male for about one week he has been having a productive cough mostly at night. +production is yellow - green. Every morning he has been coughing up phlegm. He denies fever, no chills, no nausea, no vomiting. + nasal congection,   He also came in because he broke Tooth #31 about 1.5 weeks ago. He has been taking excedrin for the pain--but it is getting worse.         Differential Diagnosis includes:  Viral infection, pneumonia, tooth decay,  gingivitis.     Plan:   Cough  1. Follow exam  2. Lungs clear  Toothache  1. Tooth decay noted  2. augmentin  3 analgesia  dispo  1. Home  2. augmentin  3. Analgesia  4. F/u [facility].            Wannetta Sender, NP          Wannetta Sender, NP  12/13/17 1621

## 2017-12-13 NOTE — ED Triage Notes (Signed)
Right lower broken tooth and URI symptoms " for a couple days"       Triage Note   Charlett LangoJohn Londyn Wotton, RN

## 2017-12-13 NOTE — Discharge Instructions (Signed)
Ibuprofen 600 mg four times per day, take with food.  Augmentin 875 mg twice per day    Peridex 0.12% 1/2 ounce swish and spit twice per day.     Warm salt water rinses four times per day.     [facility] They have a walk in clinic, first come, first serve--Monday through Friday. Be there at 8 am, doors open at 8:30 am.     Return for difficulty swallowing, fever, chills, facial swelling, nausea, vomiting, increase pain, or for any other concerns you may have.     [facility] [phone].     It was a pleasure caring for you today!!    Go home, relax, and have a wonderful Sunday!!

## 2017-12-13 NOTE — ED Notes (Signed)
Patient arrives with onset of URI symptoms last night followed by a broken tooth to the R lower molar. He denies fevers, chills, dizziness. Endorses congestion, HA. He appears in NAD. Medicated per orders. D/c paperwork provided. A&Ox4. Feels safe to drive self home.

## 2017-12-13 NOTE — First Provider Contact (Signed)
ED First Provider Contact Note    Initial provider evaluation performed by   ED First Provider Contact     Date/Time Event User Comments    12/13/17 [phone] ED First Provider Contact Tamala SerBASTIAN, Roselani Grajeda W Initial Face to Face Provider Contact        37 y/o male presenting to ED with 2-3 days of congested cough.  Pt denies shortness of breath or chest pain, denies fevers/chills, arthralgias, sore throat or headaches.      Additionally, pt notes breaking tooth #32 yesterday associated with increasing pain and swelling in cheek.  Denies difficulty with PO.  Airway patient in triage and pt in no acute distress.     Vital signs reviewed.  BP 143/83 (BP Location: Left arm)    Pulse 83    Temp 36.5 C (97.7 F) (Temporal)    Resp 18    Ht 1.676 m (5\' 6" )    Wt 88.5 kg (195 lb)    SpO2 97%    BMI 31.47 kg/m     Orders placed:  No intervention     Patient requires further evaluation.     Tamala SerBradley W Kiernan Atkerson, NP, 12/13/2017, 9:13 AM     Tamala SerBastian, Emogene Muratalla W, NP  12/13/17 [phone]

## 2018-03-01 LAB — UNMAPPED LAB RESULTS
Basophil # (HT): 0 10 3/uL — NL (ref 0.0–0.2)
Basophil % (HT): 1 % — NL (ref 0–3)
Eosinophil # (HT): 0.1 10 3/uL — NL (ref 0.0–0.6)
Eosinophil % (HT): 2 % — NL (ref 0–5)
Hematocrit (HT): 48 % — NL (ref 40–52)
Hemoglobin (HGB) (HT): 15.5 g/dL — NL (ref 13.0–18.0)
Lymphocyte # (HT): 2.2 10 3/uL — NL (ref 1.0–4.8)
Lymphocyte % (HT): 32 % — NL (ref 15–45)
MCHC (HT): 32.4 g/dL — NL (ref 32.0–37.5)
MCV (HT): 85 fL — NL (ref 80–100)
Mean Corpuscular Hemoglobin (MCH) (HT): 27.4 pg — NL (ref 26.0–34.0)
Monocyte # (HT): 0.7 10 3/uL — NL (ref 0.1–1.0)
Monocyte % (HT): 10 % — NL (ref 0–15)
Neutrophil # (HT): 3.8 10 3/uL — NL (ref 1.8–8.0)
Platelets (HT): 249 10 3/uL — NL (ref 150–450)
RBC (HT): 5.66 10 6/uL — NL (ref 4.40–6.20)
RDW (HT): 13.7 % — NL (ref 0.0–15.2)
Seg Neut % (HT): 55 % — NL (ref 45–75)
WBC (HT): 6.9 10 3/uL — NL (ref 4.0–11.0)

## 2019-07-20 ENCOUNTER — Other Ambulatory Visit: Payer: Self-pay

## 2019-07-20 ENCOUNTER — Emergency Department
Admission: EM | Admit: 2019-07-20 | Discharge: 2019-07-20 | Disposition: A | Discharge status: Home / Self Care | Payer: Medicaid - Out of State | Attending: Emergency Medicine | Admitting: Emergency Medicine

## 2019-07-20 ENCOUNTER — Emergency Department: Payer: Medicaid - Out of State

## 2019-07-20 DIAGNOSIS — F17210 Nicotine dependence, cigarettes, uncomplicated: Secondary | ICD-10-CM | POA: Insufficient documentation

## 2019-07-20 DIAGNOSIS — Z79899 Other long term (current) drug therapy: Secondary | ICD-10-CM | POA: Insufficient documentation

## 2019-07-20 DIAGNOSIS — M542 Cervicalgia: Secondary | ICD-10-CM

## 2019-07-20 DIAGNOSIS — I10 Essential (primary) hypertension: Secondary | ICD-10-CM

## 2019-07-20 LAB — BASIC METABOLIC PANEL
Anion gap: 8 (ref 5–15)
BUN: 18 mg/dL (ref 6–20)
CO2: 27 mmol/L (ref 22–32)
Calcium: 9.4 mg/dL (ref 8.9–10.3)
Chloride: 104 mmol/L (ref 98–111)
Creatinine, Ser: 1.16 mg/dL (ref 0.61–1.24)
GFR calc Af Amer: >60 mL/min (ref >60)
GFR calc non Af Amer: >60 mL/min (ref >60)
Glucose, Bld: 98 mg/dL (ref 70–99)
Potassium: 4.5 mmol/L (ref 3.5–5.1)
Sodium: 139 mmol/L (ref 135–145)

## 2019-07-20 LAB — TROPONIN I (HIGH SENSITIVITY): Troponin I (High Sensitivity): <2 ng/L (ref <18)

## 2019-07-20 LAB — CBC
HCT: 44.2 % (ref 39.0–52.0)
Hemoglobin: 14.2 g/dL (ref 13.0–17.0)
MCH: 27.9 pg (ref 26.0–34.0)
MCHC: 32.1 g/dL (ref 30.0–36.0)
MCV: 86.8 fL (ref 80.0–100.0)
Platelets: 285 10*3/uL (ref 150–400)
RBC: 5.09 MIL/uL (ref 4.22–5.81)
RDW: 13.4 % (ref 11.5–15.5)
WBC: 6.6 10*3/uL (ref 4.0–10.5)
nRBC: 0 % (ref 0.0–0.2)

## 2019-07-20 MED ORDER — SODIUM CHLORIDE 0.9% FLUSH: 3.0000 mL | Freq: Once | INTRAVENOUS | Status: DC

## 2019-07-20 MED ORDER — AMLODIPINE BESYLATE 5 MG PO TABS
5.0000 mg | ORAL_TABLET | Freq: Once | ORAL | Status: AC
  Administered 2019-07-20: 5 mg via ORAL
  Filled 2019-07-20: qty 1

## 2019-07-20 MED ORDER — NAPROXEN 500 MG PO TABS
500.0000 mg | ORAL_TABLET | Freq: Once | ORAL | Status: AC
  Administered 2019-07-20: 500 mg via ORAL
  Filled 2019-07-20: qty 1

## 2019-07-20 MED ORDER — AMLODIPINE BESYLATE 5 MG PO TABS: 5.0000 mg | ORAL_TABLET | Freq: Every day | ORAL | 1 refills | Status: AC

## 2019-07-20 NOTE — ED Notes (Signed)
Pt st that he is having posterior neck pain that is radiating down his shoulders (primarily the left). St this has been going on for the last 2weeks. MD notified

## 2019-07-20 NOTE — ED Notes (Signed)
Repeat BP reading:  repositioned and cuff size changed. New pressure 164/107 (Map 123) MD notified

## 2019-07-20 NOTE — ED Notes (Signed)
Pt took all personal belongings with him prior to ED departure

## 2019-07-20 NOTE — ED Provider Notes (Signed)
[facility] Emergency Department Provider Note   ____________________________________________   First MD Initiated Contact with Patient 07/20/19 1547     (approximate)  I have reviewed the triage vital signs and the nursing notes.   HISTORY  Chief Complaint Hypertension    HPI Shaun Rodriguez is a 38 y.o. male with no significant past medical history who presents to the ED complaining of neck pain and elevated blood pressure.  Patient reports that a couple of days ago he initially developed dull achy pain in the left side of his chest a couple of days ago.  This seemed to improve, but he developed pain along the left side of his neck and moving into his left shoulder.  This seems to be worse with movement of his left arm, but he denies any recent heavy lifting or other trauma to his arm.  He denies any trauma to his head or neck.  He has not had any associated fevers, chills, cough, or shortness of breath.  His family checked his blood pressure at home and told him it was elevated, he denies any history of hypertension.        Past Medical History:  Diagnosis Date  . Colitis     There are no active problems to display for this patient.   History reviewed. No pertinent surgical history.  Prior to Admission medications   Medication Sig Start Date End Date Taking? Authorizing Provider  amLODipine (NORVASC) 5 MG tablet Take 1 tablet (5 mg total) by mouth daily. 07/20/19 09/18/19  Chesley NoonJessup, Jobin Montelongo, MD  dicyclomine (BENTYL) 20 MG tablet Take 1 tablet (20 mg total) by mouth 2 (two) times daily. Patient not taking: Reported on 07/20/2019 10/12/15   Linwood DibblesKnapp, Jon, MD  naproxen (NAPROSYN) 500 MG tablet Take 1 tablet (500 mg total) by mouth 2 (two) times daily. Patient not taking: Reported on 07/20/2019 10/12/15   Linwood DibblesKnapp, Jon, MD    Allergies Patient has no known allergies.  No family history on file.  Social History Social History   Tobacco Use  . Smoking  status: Current Every Day Smoker    Types: Cigarettes  . Smokeless tobacco: Never Used  Substance Use Topics  . Alcohol use: Yes    Comment: occ  . Drug use: Yes    Types: Marijuana, Cocaine    Review of Systems  Constitutional: No fever/chills Eyes: No visual changes. ENT: No sore throat. Cardiovascular: Positive for chest pain. Respiratory: Denies shortness of breath. Gastrointestinal: No abdominal pain.  No nausea, no vomiting.  No diarrhea.  No constipation. Genitourinary: Negative for dysuria. Musculoskeletal: Negative for back pain. Skin: Negative for rash. Neurological: Negative for headaches, focal weakness or numbness.  ____________________________________________   PHYSICAL EXAM:  VITAL SIGNS: ED Triage Vitals  Enc Vitals Group     BP 07/20/19 1426 (!) 144/89     Pulse Rate 07/20/19 1426 79     Resp 07/20/19 1426 17     Temp 07/20/19 1430 98.2 F (36.8 C)     Temp Source 07/20/19 1426 Oral     SpO2 07/20/19 1426 99 %     Weight 07/20/19 1427 190 lb (86.2 kg)     Height 07/20/19 1427 5\' 6"  (1.676 m)     Head Circumference --      Peak Flow --      Pain Score 07/20/19 1426 10     Pain Loc --      Pain Edu? --  Excl. in [place]? --     Constitutional: Alert and oriented. Eyes: Conjunctivae are normal. Head: Atraumatic. Nose: No congestion/rhinnorhea. Mouth/Throat: Mucous membranes are moist. Neck: Normal ROM, tenderness along left neck and trapezius. Cardiovascular: Normal rate, regular rhythm. Grossly normal heart sounds. Respiratory: Normal respiratory effort.  No retractions. Lungs CTAB. Gastrointestinal: Soft and nontender. No distention. Genitourinary: deferred Musculoskeletal: No lower extremity tenderness nor edema. Neurologic:  Normal speech and language. No gross focal neurologic deficits are appreciated. Skin:  Skin is warm, dry and intact. No rash noted. Psychiatric: Mood and affect are normal. Speech and behavior are normal.   ____________________________________________   LABS (all labs ordered are listed, but only abnormal results are displayed)  Labs Reviewed  BASIC METABOLIC PANEL  CBC  TROPONIN I (HIGH SENSITIVITY)   ____________________________________________  EKG  ED ECG REPORT I, Blake Divine, the attending physician, personally viewed and interpreted this ECG.   Date: 07/20/2019  EKG Time: 14:35  Rate: 80  Rhythm: normal sinus rhythm  Axis: LAD  Intervals:none  ST&T Change: T wave flattening laterally    PROCEDURES  Procedure(s) performed (including Critical Care):  Procedures   ____________________________________________   INITIAL IMPRESSION / ASSESSMENT AND PLAN / ED COURSE       38 year old male presents to the ED with recent left-sided chest pain that is now resolved, now with primarily left neck pain moving into his left shoulder.  This appears most likely musculoskeletal in origin given his tenderness in this area.  EKG without acute ischemic changes and troponin is negative, given his constant symptoms for at least the past 2 days, second set troponin not indicated at this time.  BP remains significantly elevated here despite pain being under control, will start patient on Norvasc and counseled on need for follow-up with PCP.  Counseled to return to the ED for new or worsening symptoms, patient agrees with plan.      ____________________________________________   FINAL CLINICAL IMPRESSION(S) / ED DIAGNOSES  Final diagnoses:  Neck pain  Hypertension, unspecified type     ED Discharge Orders         Ordered    amLODipine (NORVASC) 5 MG tablet  Daily     07/20/19 1757           Note:  This document was prepared using Dragon voice recognition software and may include unintentional dictation errors.   Blake Divine, MD 07/20/19 2126

## 2019-07-20 NOTE — ED Triage Notes (Signed)
Pt states he had a pain in the left side of his chest 2 weeks ago and today having like a pulled muscle pain to the left shoulder/neck and his sister used a home cuff and checked his b/p 162/111 and pt was concerned.

## 2020-01-20 LAB — UNMAPPED LAB RESULTS
Basophil # (HT): 0 10 3/uL — NL (ref 0.0–0.2)
Basophil % (HT): 1 % — NL (ref 0–3)
Eosinophil # (HT): 0.2 10 3/uL — NL (ref 0.0–0.6)
Eosinophil % (HT): 2 % — NL (ref 0–5)
Hematocrit (HT): 45 % — NL (ref 40–52)
Hemoglobin (HGB) (HT): 15 g/dL — NL (ref 13.0–18.0)
Lymphocyte # (HT): 1.5 10 3/uL — NL (ref 1.0–4.8)
Lymphocyte % (HT): 16 % — NL (ref 15–45)
MCHC (HT): 33.5 g/dL — NL (ref 32.0–37.5)
MCV (HT): 83 fL — NL (ref 80–100)
Mean Corpuscular Hemoglobin (MCH) (HT): 27.9 pg — NL (ref 26.0–34.0)
Monocyte # (HT): 1 10 3/uL — NL (ref 0.1–1.0)
Monocyte % (HT): 11 % — NL (ref 0–15)
Neutrophil # (HT): 6.2 10 3/uL — NL (ref 1.8–8.0)
Platelets (HT): 317 10 3/uL — NL (ref 150–450)
RBC (HT): 5.37 10 6/uL — NL (ref 4.40–6.20)
RDW (HT): 14 % — NL (ref 0.0–15.2)
Seg Neut % (HT): 70 % — NL (ref 45–75)
WBC (HT): 8.9 10 3/uL — NL (ref 4.0–11.0)

## 2020-01-21 LAB — UNMAPPED LAB RESULTS
Hematocrit (HT): 45 % — NL (ref 40–52)
Hemoglobin (HGB) (HT): 14.6 g/dL — NL (ref 13.0–18.0)
MCHC (HT): 32.4 g/dL — NL (ref 32.0–37.5)
MCV (HT): 86 fL — NL (ref 80–100)
Mean Corpuscular Hemoglobin (MCH) (HT): 28 pg — NL (ref 26.0–34.0)
Platelets (HT): 283 10 3/uL — NL (ref 150–450)
RBC (HT): 5.22 10 6/uL — NL (ref 4.40–6.20)
RDW (HT): 13.6 % — NL (ref 0.0–15.2)
WBC (HT): 8 10 3/uL — NL (ref 4.0–11.0)

## 2020-01-21 MED ORDER — DOCUSATE SODIUM 100 MG PO CAPS *I*
100.00 mg | ORAL_CAPSULE | Freq: Two times a day (BID) | ORAL | Status: DC | PRN
Start: ? — End: 2020-01-21

## 2020-01-21 MED ORDER — ASPIRIN 81 MG PO TBEC *I*
81.00 mg | DELAYED_RELEASE_TABLET | Freq: Every day | ORAL | Status: DC
Start: 2020-01-22 — End: 2020-01-21

## 2020-01-21 MED ORDER — HYDROXYZINE HCL 10 MG PO TABS *I*
10.00 mg | ORAL_TABLET | Freq: Three times a day (TID) | ORAL | Status: DC | PRN
Start: ? — End: 2020-01-21

## 2020-01-21 MED ORDER — HYDRALAZINE HCL 10 MG PO TABS *I*
10.00 mg | ORAL_TABLET | Freq: Three times a day (TID) | ORAL | Status: DC | PRN
Start: ? — End: 2020-01-21

## 2020-01-21 MED ORDER — AMLODIPINE BESYLATE 5 MG PO TABS *I*
2.50 mg | ORAL_TABLET | Freq: Every day | ORAL | Status: DC
Start: 2020-01-22 — End: 2020-01-21

## 2020-01-21 MED ORDER — LIDOCAINE 4 % EX PATCH *A*
1.00 | MEDICATED_PATCH | Freq: Every day | CUTANEOUS | Status: DC
Start: 2020-01-22 — End: 2020-01-21

## 2020-02-10 ENCOUNTER — Emergency Department
Admission: EM | Admit: 2020-02-10 | Discharge: 2020-02-10 | Discharge status: Against Medical Advice | Payer: Self-pay | Source: Ambulatory Visit | Attending: Emergency Medicine | Admitting: Emergency Medicine

## 2020-02-10 ENCOUNTER — Other Ambulatory Visit: Payer: Self-pay | Admitting: Cardiology

## 2020-02-10 DIAGNOSIS — R9431 Abnormal electrocardiogram [ECG] [EKG]: Secondary | ICD-10-CM

## 2020-02-10 DIAGNOSIS — Z5320 Procedure and treatment not carried out because of patient's decision for unspecified reasons: Secondary | ICD-10-CM | POA: Insufficient documentation

## 2020-02-10 DIAGNOSIS — R079 Chest pain, unspecified: Secondary | ICD-10-CM | POA: Insufficient documentation

## 2020-02-10 DIAGNOSIS — R0789 Other chest pain: Secondary | ICD-10-CM

## 2020-02-10 MED ORDER — ASPIRIN 81 MG PO CHEW *I*
324.0000 mg | CHEWABLE_TABLET | Freq: Once | ORAL | Status: DC
Start: 2020-02-10 — End: 2020-02-11

## 2020-02-10 NOTE — First Provider Contact (Signed)
ED First Provider Contact Note    Initial provider evaluation performed by   ED First Provider Contact     Date/Time Event User Comments    02/10/20 1443 ED First Provider Contact Arayla Kruschke, [facility] ANN Initial Face to Face Provider Contact        Patient with PMH of HTN, presents to ED with 2 days intermittent CP, states has Chest pain when BP is elevated,  reports pain is worse with movement, but also states he had a" minor MI " about a month ago, feels somewhat similar,  but not the same as his MI  Vital signs reviewed.    Assessment: CP    Orders placed:  EKG, LABS and salien lock, telemetry     Patient requires further evaluation.     Jaycion Treml [place], NP, 02/10/2020, 2:43 PM     Jarone Ostergaard, Chales Abrahams, NP  02/10/20 1445

## 2020-02-10 NOTE — ED Triage Notes (Addendum)
Pt presents with chest pain since yesterday, reports it is worse with movement, pt reports had a minor MI about a month ago, fells somewhat similar but not the same as his MI, pt also reports he does get chest pain like this when his BP is really high. ekg in triage     Triage Note   Ashok Norris, RN

## 2020-02-10 NOTE — ED Notes (Signed)
Overhead paged 3x to see staff in the [facility], pt not visualized. Assuming patient has left.

## 2020-02-15 LAB — UNMAPPED LAB RESULTS
Basophil # (HT): 0.1 10 3/uL — NL (ref 0.0–0.2)
Basophil % (HT): 1 % — NL (ref 0–3)
Eosinophil # (HT): 0.1 10 3/uL — NL (ref 0.0–0.6)
Eosinophil % (HT): 2 % — NL (ref 0–5)
Hematocrit (HT): 43 % — NL (ref 40–52)
Hemoglobin (HGB) (HT): 14 g/dL — NL (ref 13.0–18.0)
Lymphocyte # (HT): 1.8 10 3/uL — NL (ref 1.0–4.8)
Lymphocyte % (HT): 20 % — NL (ref 15–45)
MCHC (HT): 32.4 g/dL — NL (ref 32.0–37.5)
MCV (HT): 87 fL — NL (ref 80–100)
Mean Corpuscular Hemoglobin (MCH) (HT): 28.2 pg — NL (ref 26.0–34.0)
Monocyte # (HT): 0.7 10 3/uL — NL (ref 0.1–1.0)
Monocyte % (HT): 8 % — NL (ref 0–15)
Neutrophil # (HT): 6 10 3/uL — NL (ref 1.8–8.0)
Platelets (HT): 288 10 3/uL — NL (ref 150–450)
RBC (HT): 4.97 10 6/uL — NL (ref 4.40–6.20)
RDW (HT): 13.5 % — NL (ref 0.0–15.2)
Seg Neut % (HT): 69 % — NL (ref 45–75)
WBC (HT): 8.7 10 3/uL — NL (ref 4.0–11.0)

## 2020-02-16 LAB — UNMAPPED LAB RESULTS
Basophil # (HT): 0 10 3/uL — NL (ref 0.0–0.2)
Basophil % (HT): 0 % — NL (ref 0–3)
Eosinophil # (HT): 0.2 10 3/uL — NL (ref 0.0–0.6)
Eosinophil % (HT): 3 % — NL (ref 0–5)
Hematocrit (HT): 42 % — NL (ref 40–52)
Hemoglobin (HGB) (HT): 13.4 g/dL — NL (ref 13.0–18.0)
Lymphocyte # (HT): 2.3 10 3/uL — NL (ref 1.0–4.8)
Lymphocyte % (HT): 30 % — NL (ref 15–45)
MCHC (HT): 31.8 g/dL — ABNORMAL LOW (ref 32.0–37.5)
MCV (HT): 88 fL — NL (ref 80–100)
Mean Corpuscular Hemoglobin (MCH) (HT): 27.9 pg — NL (ref 26.0–34.0)
Monocyte # (HT): 0.6 10 3/uL — NL (ref 0.1–1.0)
Monocyte % (HT): 8 % — NL (ref 0–15)
Neutrophil # (HT): 4.5 10 3/uL — NL (ref 1.8–8.0)
Platelets (HT): 274 10 3/uL — NL (ref 150–450)
RBC (HT): 4.8 10 6/uL — NL (ref 4.40–6.20)
RDW (HT): 13.6 % — NL (ref 0.0–15.2)
Seg Neut % (HT): 58 % — NL (ref 45–75)
WBC (HT): 7.7 10 3/uL — NL (ref 4.0–11.0)

## 2020-02-16 LAB — EKG 12-LEAD
P: 41 deg
PR: 128 ms
QRS: 1 deg
QRSD: 86 ms
QT: 327 ms
QTc: 414 ms
Rate: 96 {beats}/min
T: -6 deg

## 2020-02-16 MED ORDER — KETOROLAC TROMETHAMINE 30 MG/ML IJ SOLN *I*
15.00 mg | Freq: Four times a day (QID) | INTRAMUSCULAR | Status: DC | PRN
Start: ? — End: 2020-02-16

## 2020-02-16 MED ORDER — AMLODIPINE BESYLATE 5 MG PO TABS *I*
2.50 mg | ORAL_TABLET | Freq: Every day | ORAL | Status: DC
Start: 2020-02-17 — End: 2020-02-16

## 2020-02-16 MED ORDER — GENERIC EXTERNAL MEDICATION
Status: DC
Start: ? — End: 2020-02-16

## 2020-02-16 MED ORDER — NITROGLYCERIN 0.4 MG SL SUBL *I*
0.40 mg | SUBLINGUAL_TABLET | SUBLINGUAL | Status: DC | PRN
Start: ? — End: 2020-02-16

## 2020-02-16 MED ORDER — DOCUSATE SODIUM 100 MG PO CAPS *I*
100.00 mg | ORAL_CAPSULE | Freq: Two times a day (BID) | ORAL | Status: DC | PRN
Start: ? — End: 2020-02-16

## 2020-02-16 MED ORDER — SODIUM CHLORIDE 0.9 % INJ (FLUSH) WRAPPED *I*
3.00 mL | Freq: Three times a day (TID) | INTRAMUSCULAR | Status: DC
Start: 2020-02-16 — End: 2020-02-16

## 2020-02-16 MED ORDER — ENOXAPARIN SODIUM 60 MG/0.6ML IJ SOSY *I*
60.00 mg | PREFILLED_SYRINGE | INTRAMUSCULAR | Status: DC
Start: 2020-02-16 — End: 2020-02-16

## 2020-02-16 MED ORDER — ASPIRIN 81 MG PO TBEC *I*
81.00 mg | DELAYED_RELEASE_TABLET | Freq: Every day | ORAL | Status: DC
Start: 2020-02-17 — End: 2020-02-16

## 2020-03-08 ENCOUNTER — Other Ambulatory Visit
Admission: RE | Admit: 2020-03-08 | Discharge: 2020-03-08 | Disposition: A | Discharge status: Home / Self Care | Payer: Medicaid Other | Source: Ambulatory Visit | Attending: Physician Assistant | Admitting: Physician Assistant

## 2020-03-08 DIAGNOSIS — U071 COVID-19: Secondary | ICD-10-CM | POA: Insufficient documentation

## 2020-03-09 LAB — COVID-19 PCR

## 2020-03-09 LAB — COVID-19 NAAT (PCR): COVID-19 NAAT (PCR): POSITIVE — AB

## 2020-04-04 ENCOUNTER — Emergency Department: Payer: Medicaid Other

## 2020-04-04 ENCOUNTER — Emergency Department
Admission: EM | Admit: 2020-04-04 | Discharge: 2020-04-04 | Disposition: A | Discharge status: Home / Self Care | Payer: Medicaid Other | Source: Ambulatory Visit | Attending: Emergency Medicine | Admitting: Emergency Medicine

## 2020-04-04 ENCOUNTER — Encounter: Payer: Self-pay | Admitting: Emergency Medicine

## 2020-04-04 ENCOUNTER — Other Ambulatory Visit: Payer: Self-pay | Admitting: Cardiovascular Disease

## 2020-04-04 DIAGNOSIS — R110 Nausea: Secondary | ICD-10-CM

## 2020-04-04 DIAGNOSIS — Z87891 Personal history of nicotine dependence: Secondary | ICD-10-CM | POA: Insufficient documentation

## 2020-04-04 DIAGNOSIS — R109 Unspecified abdominal pain: Secondary | ICD-10-CM

## 2020-04-04 DIAGNOSIS — R932 Abnormal findings on diagnostic imaging of liver and biliary tract: Secondary | ICD-10-CM

## 2020-04-04 DIAGNOSIS — R1011 Right upper quadrant pain: Secondary | ICD-10-CM

## 2020-04-04 DIAGNOSIS — R9431 Abnormal electrocardiogram [ECG] [EKG]: Secondary | ICD-10-CM

## 2020-04-04 DIAGNOSIS — R079 Chest pain, unspecified: Secondary | ICD-10-CM | POA: Insufficient documentation

## 2020-04-04 LAB — CBC AND DIFFERENTIAL
Baso # K/uL: 0.1 10*3/uL (ref 0.0–0.1)
Basophil %: 0.7 %
Eos # K/uL: 0.1 10*3/uL (ref 0.0–0.5)
Eosinophil %: 0.9 %
Hematocrit: 48 % (ref 40–51)
Hemoglobin: 14.7 g/dL (ref 13.7–17.5)
IMM Granulocytes #: 0 10*3/uL (ref 0.0–0.0)
IMM Granulocytes: 0.3 %
Lymph # K/uL: 1.4 10*3/uL (ref 1.3–3.6)
Lymphocyte %: 13.4 %
MCH: 27 pg (ref 26–32)
MCHC: 31 g/dL — ABNORMAL LOW (ref 32–37)
MCV: 87 fL (ref 79–92)
Mono # K/uL: 0.9 10*3/uL — ABNORMAL HIGH (ref 0.3–0.8)
Monocyte %: 8.1 %
Neut # K/uL: 8 10*3/uL — ABNORMAL HIGH (ref 1.8–5.4)
Nucl RBC # K/uL: 0 10*3/uL (ref 0.0–0.0)
Nucl RBC %: 0 /100 WBC (ref 0.0–0.2)
Platelets: 329 10*3/uL (ref 150–330)
RBC: 5.5 MIL/uL (ref 4.6–6.1)
RDW: 13.5 % (ref 11.6–14.4)
Seg Neut %: 76.6 %
WBC: 10.5 10*3/uL — ABNORMAL HIGH (ref 4.2–9.1)

## 2020-04-04 LAB — EKG 12-LEAD
P: 74 deg
PR: 136 ms
QRS: 55 deg
QRSD: 82 ms
QT: 390 ms
QTc: 472 ms
Rate: 88 {beats}/min
T: 83 deg

## 2020-04-04 LAB — TROPONIN T 0 HR HIGH SENSITIVITY (IP/ED ONLY): TROP T 0 HR High Sensitivity: <6 ng/L (ref 0–21)

## 2020-04-04 LAB — RUQ PANEL (ED ONLY)
ALT: 44 U/L (ref 0–50)
AST: 34 U/L (ref 0–50)
Albumin: 4.4 g/dL (ref 3.5–5.2)
Alk Phos: 105 U/L (ref 40–130)
Amylase: 57 U/L (ref 28–100)
Bilirubin,Direct: <0.2 mg/dL (ref 0.0–0.3)
Bilirubin,Total: 0.5 mg/dL (ref 0.0–1.2)
Lipase: 42 U/L (ref 13–60)
Total Protein: 7.5 g/dL (ref 6.3–7.7)

## 2020-04-04 LAB — PLASMA PROF 7 (ED ONLY)
Anion Gap,PL: 18 — ABNORMAL HIGH (ref 7–16)
CO2,Plasma: 20 mmol/L (ref 20–28)
Chloride,Plasma: 100 mmol/L (ref 96–108)
Creatinine: 1.23 mg/dL — ABNORMAL HIGH (ref 0.67–1.17)
GFR,Black: 84 *
GFR,Caucasian: 73 *
Glucose,Plasma: 147 mg/dL — ABNORMAL HIGH (ref 60–99)
Potassium,Plasma: 4.1 mmol/L (ref 3.3–4.6)
Sodium,Plasma: 138 mmol/L (ref 133–145)
UN,Plasma: 14 mg/dL (ref 6–20)

## 2020-04-04 MED ORDER — KETOROLAC TROMETHAMINE 30 MG/ML IJ SOLN *I*
30.0000 mg | Freq: Once | INTRAMUSCULAR | Status: DC
Start: 2020-04-04 — End: 2020-04-04

## 2020-04-04 NOTE — ED Provider Notes (Signed)
History     Chief Complaint   Patient presents with    Chest Pain     Patient is a 39 yr old man presents for chest pain sob x 3 days. Has been seen at [facility] multiple times. States had negative stress test few mos ago. Has had nausea, vomiting. Eats lot of fried food, noticing he has right flank/ruq pain as well. No hx gallstones.          Medical/Surgical/Family History     No past medical history on file.     There is no problem list on file for this patient.           Past Surgical History:   Procedure Laterality Date    arm surgery      right    SHOULDER SURGERY      left     Family History   Problem Relation Age of Onset    Cancer Mother         breast           Social History     Tobacco Use    Smoking status: Former Smoker     Quit date: 10/24/2008     Years since quitting: 11.4    Smokeless tobacco: Never Used   Substance Use Topics    Alcohol use: Yes     Alcohol/week: 0.8 standard drinks     Types: 1 Standard drinks or equivalent per week     Comment: about 1 glass of hennessy per week    Drug use: Yes     Types: Marijuana     Living Situation     Questions Responses    Patient lives with Alone    Homeless No    Caregiver for other family member No    External Services None    Employment Employed    Domestic Violence Risk                 Review of Systems   Review of Systems   Constitutional: Negative for activity change.   HENT: Negative for sore throat.    Respiratory: Negative for shortness of breath.    Cardiovascular: Negative for chest pain.   Gastrointestinal: Positive for abdominal pain.   Musculoskeletal: Negative for arthralgias.   Skin: Negative for rash.   Neurological: Negative for dizziness.   Psychiatric/Behavioral: Negative for agitation.       Physical Exam     Triage Vitals  Triage Start: Start, (04/04/20 1046)   First Recorded BP: 148/89, Resp: 16, Temp: 35.6 C (96.1 F), Temp src: TEMPORAL Oxygen Therapy SpO2: 99 %, O2 Device: None (Room air), Heart Rate: 94, (04/04/20 1044)   .  First Pain Reported  0-10 Scale: 10, (04/04/20 1044)       Physical Exam  Vitals and nursing note reviewed.   Constitutional:       Appearance: He is well-developed.   HENT:      Head: Normocephalic and atraumatic.   Eyes:      Conjunctiva/sclera: Conjunctivae normal.      Pupils: Pupils are equal, round, and reactive to light.   Cardiovascular:      Rate and Rhythm: Normal rate and regular rhythm.      Heart sounds: Normal heart sounds.   Pulmonary:      Effort: Pulmonary effort is normal.      Breath sounds: Normal breath sounds.   Abdominal:      General: Bowel sounds are  normal. There is abdominal bruit.      Palpations: Abdomen is soft.   Musculoskeletal:         General: Normal range of motion.      Cervical back: Normal range of motion and neck supple.   Skin:     General: Skin is warm and dry.      Capillary Refill: Capillary refill takes less than 2 seconds.   Neurological:      General: No focal deficit present.      Mental Status: He is alert and oriented to person, place, and time.   Psychiatric:         Mood and Affect: Mood normal.         Medical Decision Making   Patient seen by me on:  04/04/2020    Assessment:  39 yr old man reports chest pain, ruq pain, nausea    Differential diagnosis:  Pleurisy, acute coronary syndrome, pneumothorax, biliary disease, pancreatitis    Plan:  Ekg,labs    Independent review of: Existing labs, XRays, chart/prior records    ED Course and Disposition:  Ekg nsr nonspecific st changes similar to prior ekg  Labs sent  Refused iv line  ruq [place]            Mat Carne, MD          Mat Carne, MD  04/04/20 1205

## 2020-04-04 NOTE — ED Notes (Signed)
Pt refusing IV after RN attempt. Blood work obtained. Covering provider aware.

## 2020-04-04 NOTE — ED Triage Notes (Signed)
Complains of chest pain and shortness of breath worsening for the 3 days. Also complains of right flank pain. + N/V. EKG at triage.        Triage Note   Jobe Gibbon, RN

## 2021-01-29 ENCOUNTER — Emergency Department
Admission: EM | Admit: 2021-01-29 | Discharge: 2021-01-30 | Discharge status: Against Medical Advice | Payer: Medicaid Other | Source: Ambulatory Visit

## 2021-01-29 DIAGNOSIS — R000 Tachycardia, unspecified: Secondary | ICD-10-CM

## 2021-01-29 NOTE — ED Triage Notes (Addendum)
Cleaning apartment, got Lysol on hands and wiped eyes with it. Slight amount of swelling to eyes. Took Cetrizine hydrochloride because it states it helps with allergic reaction. No SOB. Non compliant with HTN meds. EKG in triage.             Prehospital medications given: No

## 2021-01-29 NOTE — ED Notes (Signed)
WAITING ROOM CHECK    Patient visualized in waiting room. Patient appears in no acute distress. Patient's chart reviewed and most recent vital signs reviewed. Will continue to monitor.

## 2021-01-30 NOTE — ED Notes (Signed)
Called and overhead paged x2, no response

## 2021-04-02 IMAGING — CR DG CHEST 2V
1 series · 2 of 2 positions shown · non-contrast
Comparison: October 12, 2015

CLINICAL DATA: Chest pain

EXAM:
CHEST - 2 VIEW

[Series 1: dg chest 2 view · 0.14mm/px · 2 of 2 slices shown]
[im 1/2]
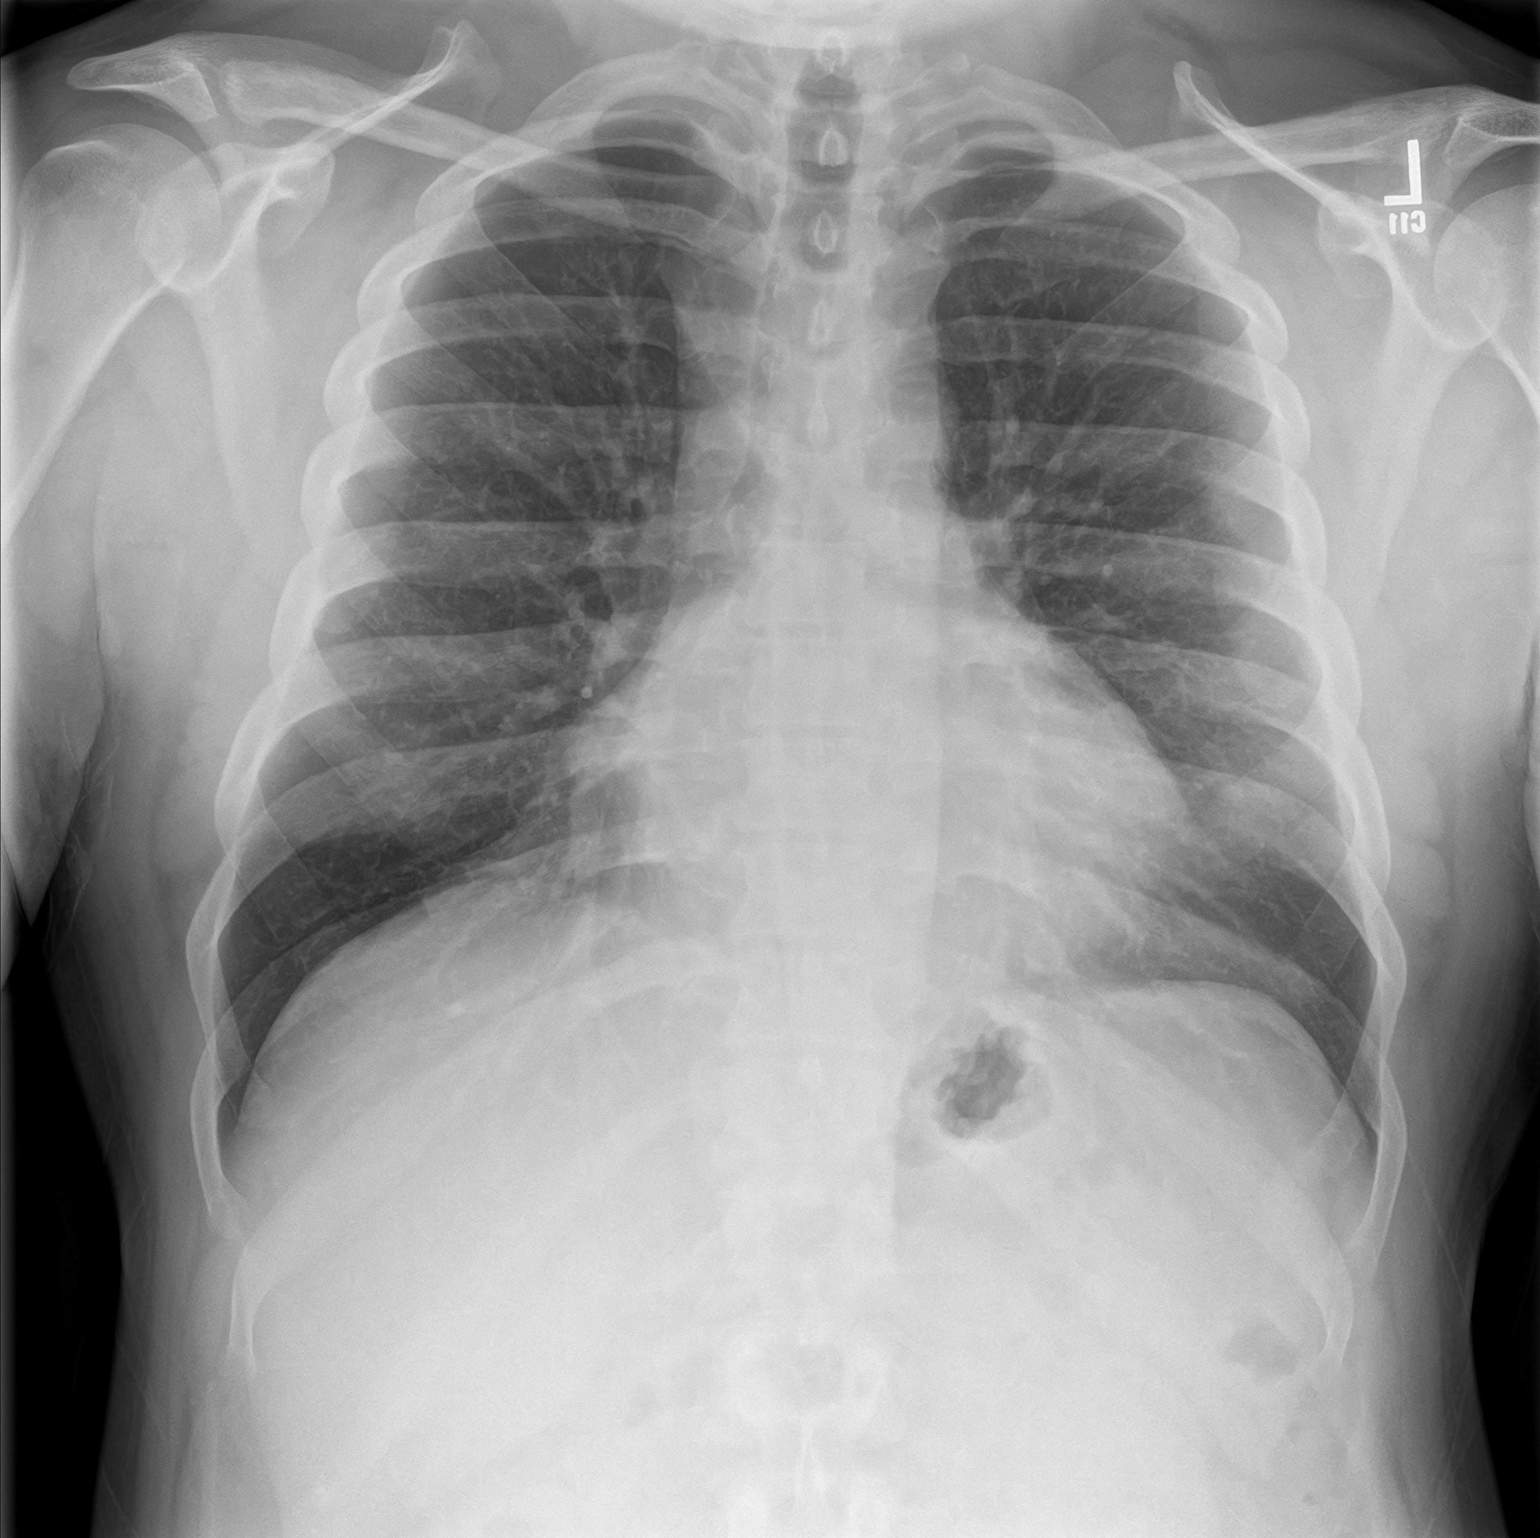
[im 2/2]
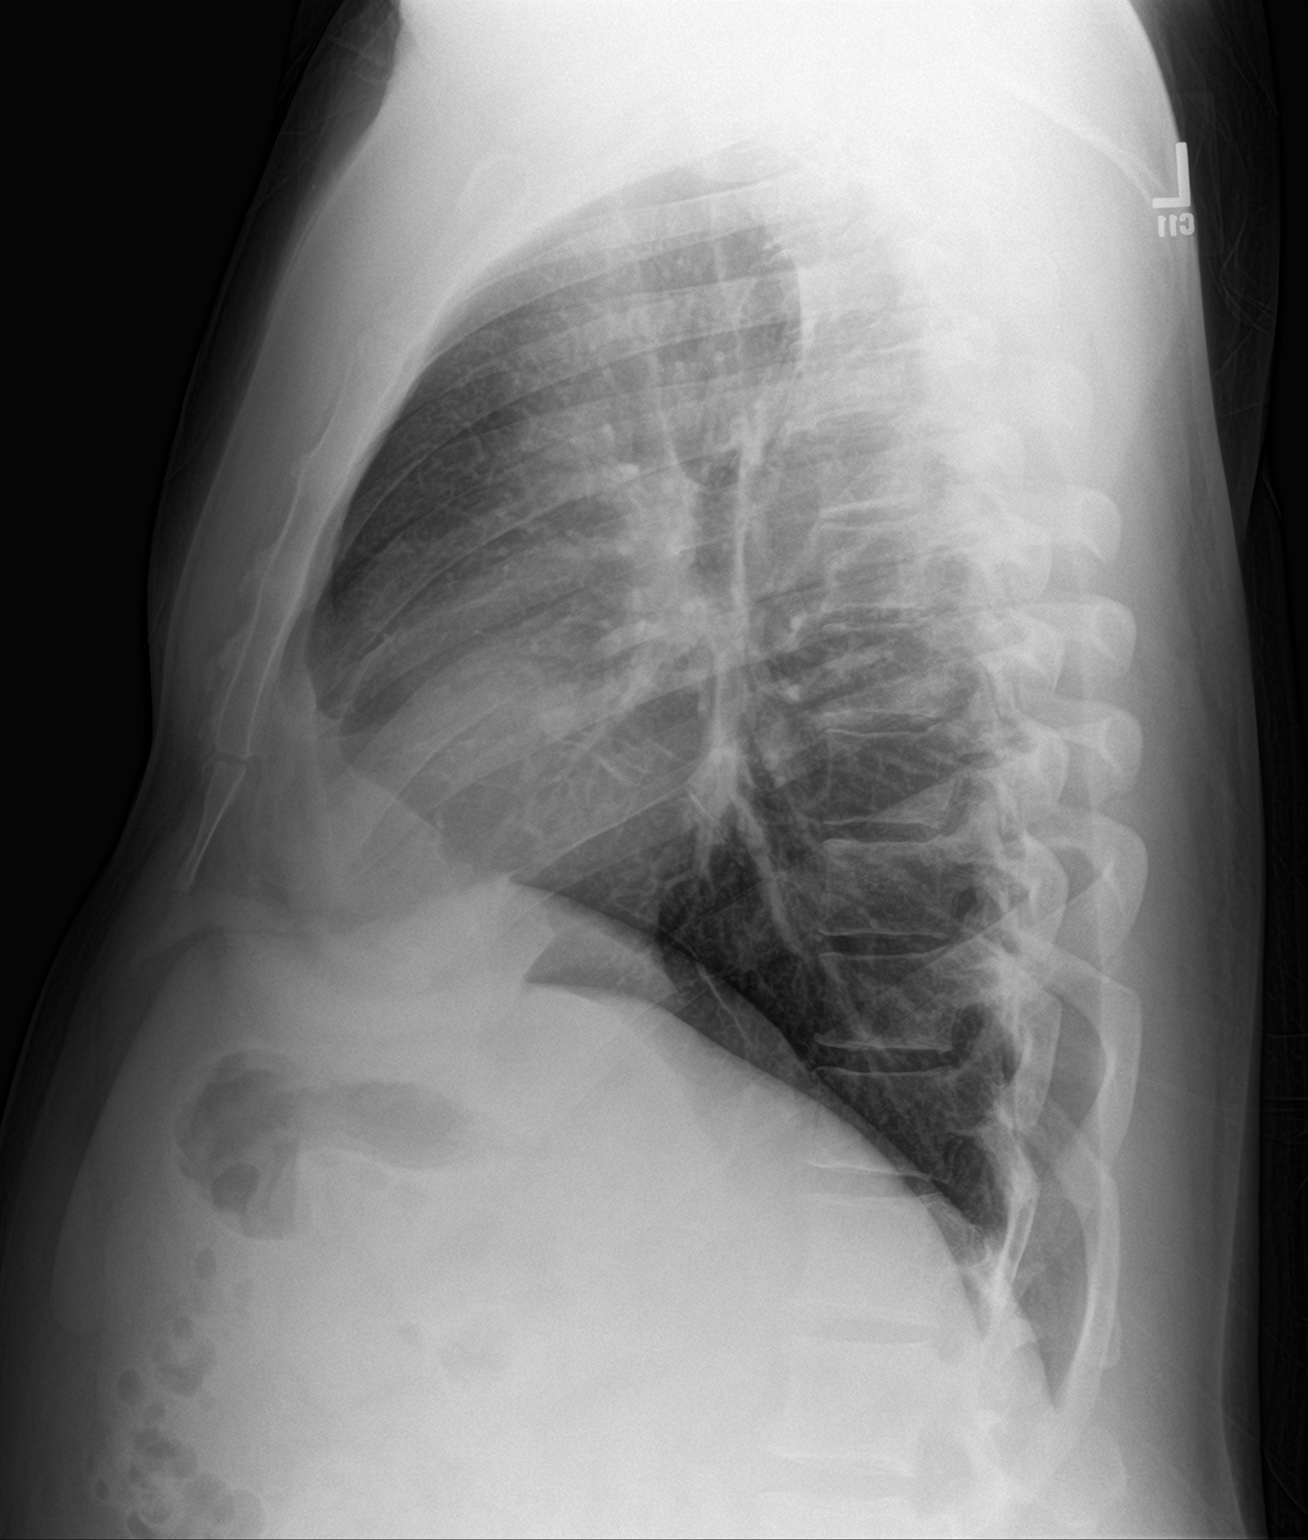

[2 of 2 positions shown; findings below may reference images not displayed]

FINDINGS: Lungs are clear. Heart size and pulmonary vascularity are normal. No
adenopathy. No pneumothorax. No bone lesions.
IMPRESSION: No edema or consolidation.

## 2021-04-06 ENCOUNTER — Ambulatory Visit
Admission: AD | Admit: 2021-04-06 | Discharge: 2021-04-06 | Disposition: A | Discharge status: Home / Self Care | Payer: Medicaid Other | Source: Ambulatory Visit

## 2021-04-06 ENCOUNTER — Ambulatory Visit
Admit: 2021-04-06 | Discharge: 2021-04-06 | Disposition: A | Discharge status: Home / Self Care | Payer: Medicaid Other | Admitting: Radiology

## 2021-04-06 ENCOUNTER — Encounter: Payer: Self-pay | Admitting: Emergency Medicine

## 2021-04-06 DIAGNOSIS — M7731 Calcaneal spur, right foot: Secondary | ICD-10-CM

## 2021-04-06 DIAGNOSIS — S93401A Sprain of unspecified ligament of right ankle, initial encounter: Secondary | ICD-10-CM

## 2021-04-06 DIAGNOSIS — R2241 Localized swelling, mass and lump, right lower limb: Secondary | ICD-10-CM

## 2021-04-06 NOTE — Discharge Instructions (Signed)
You were seen at Urgent Care today for right ankle pain and swelling after injury. Your xrays show no sign of fracture or dislocation.     Rest  Rest is required to allow your body to heal. Generally, you can resume your routine activities when you are comfortable and have been given permission by your health care provider.  Ice  Icing your injury helps to keep the swelling down and it reduces pain. Do not apply ice directly to your skin.  Put ice in a plastic bag.  Place a towel between your skin and the bag.  Leave the ice on for 20 minutes, 2-3 times per day.  Do this for as long as you are directed by your health care provider.  Compression  Compression helps to keep swelling down, gives support, and helps with discomfort. Compression may be done with an elastic bandage.  Elevation  Elevation helps to reduce swelling and it decreases pain. If possible, your injured area should be placed at or above the level of your heart or the center of your chest.    Please alternate the use of Tylenol 650 mg and ibuprofen 600 mg every 3 hours until your symptoms improve.    Your symptoms should begin to improve after 1 week of the aforementioned measures. If they are not improving at all please call orthopedics for further evaluation. Their phone number is: [phone].    Please return to urgent care or the emergency department if you develop any new or worsening symptoms, including fever/chills, worsening pain, worsening redness/swelling, new onset numbness/tingling, streaking up the leg, or decreased range of motion of the affected extremity.

## 2021-04-06 NOTE — UC Provider Note (Signed)
History     Chief Complaint   Patient presents with    Ankle Injury     Patient is a 40 year old male no significant past medical history who presents with right ankle injury that took place yesterday.  Patient reports that he was running in his yard when he everted his right ankle.  He reports that the pain is more significant today than yesterday.  He has not taken any medications over-the-counter for his symptoms.  He denies any prior history of injury or fracture to the ankle in the past.  He denies fever/chills, bruising, swelling, numbness/tingling or decreased range of motion.  He reports he has significant pain with weightbearing.          Medical/Surgical/Family History     History reviewed. No pertinent past medical history.     There is no problem list on file for this patient.           Past Surgical History:   Procedure Laterality Date    arm surgery      right    SHOULDER SURGERY      left     Family History   Problem Relation Age of Onset    Cancer Mother         breast           Social History     Tobacco Use    Smoking status: Former Smoker     Quit date: 10/24/2008     Years since quitting: 12.4    Smokeless tobacco: Never Used   Substance Use Topics    Alcohol use: Yes     Alcohol/week: 0.8 standard drinks     Types: 1 Standard drinks or equivalent per week     Comment: about 1 glass of hennessy per week    Drug use: Yes     Types: Marijuana     Living Situation     Questions Responses    Patient lives with Alone    Homeless No    Caregiver for other family member No    External Services None    Employment Employed    Domestic Violence Risk                 Review of Systems   Review of Systems   Constitutional: Negative for chills and fever.   Musculoskeletal: Positive for arthralgias (right ankle), joint swelling (right ankle) and myalgias (right ankle).   Skin: Negative for color change and wound.   Neurological: Negative for weakness and numbness.       Physical Exam   Triage  Vitals  Triage Start: Start, (04/06/21 1503)   First Recorded BP: (!) 145/93, Resp: 18, Temp: 37 C (98.6 F), Temp src: TEMPORAL Oxygen Therapy SpO2: 98 %, Oximetry Source: Rt Hand, O2 Device: None (Room air), Heart Rate: 96, (04/06/21 1502)  .      Physical Exam  Vitals and nursing note reviewed.   Constitutional:       General: He is not in acute distress.     Appearance: He is well-developed. He is not ill-appearing, toxic-appearing or diaphoretic.   Cardiovascular:      Rate and Rhythm: Normal rate.   Pulmonary:      Effort: Pulmonary effort is normal. No respiratory distress.   Musculoskeletal:         General: Tenderness present. No deformity.      Right lower leg: Normal.      Right ankle: Swelling  present. Tenderness present over the lateral malleolus and medial malleolus. No base of 5th metatarsal or proximal fibula tenderness. Normal range of motion.      Right Achilles Tendon: Normal.      Right foot: Normal.   Skin:     General: Skin is warm and dry.      Capillary Refill: Capillary refill takes less than 2 seconds.      Findings: No erythema.   Neurological:      Mental Status: He is alert and oriented to person, place, and time.      Sensory: No sensory deficit.   Psychiatric:         Behavior: Behavior normal. Behavior is cooperative.         Thought Content: Thought content normal.          Medical Decision Making        Medical Decision Making  Assessment:    40 year old male presents with right ankle pain after inversion injury yesterday.  Vital signs reviewed and stable, CMS intact.    Differential diagnosis:    Ankle Sprain  Contusion  Fracture  Ligament Injury  Muscle Strain      Plan and Results:     Encounter orders    Orders Placed This Encounter      ED/UC Aircast Ankle Stirrups - Standard      Crutches      * Ankle RIGHT standard AP, lateral, mortise views      Apply ace wrap      Please Provide Supply: Ankle Aircast      Please Provide Supply: Crutches        XRay results  * Ankle RIGHT  standard AP, lateral, mortise views    Result Date: 04/06/2021  04/06/2021 3:26 PM RIGHT ANKLE X-RAYS CLINICAL INFORMATION:  Invertion injury with tenderness of medial mallelous, evaluate for a fracture. COMPARISON:  None. PROCEDURE: Three projections of the right ankle were obtained. IMPRESSION/FINDINGS:  Small soft tissue swelling overlying the medial malleolus. No acute fracture or dislocation. Ankle mortise is symmetric. Minimal plantar calcaneal spurring. Mild spurring at the anterior tibial plafond and dorsal talar neck. END OF IMPRESSION I have personally reviewed the images and the Resident's/Fellow's interpretation and agree with or edited the findings. [facility] submits this DICOM format image data and final report to the [facility], an independent secure electronic health information exchange, on a reciprocally searchable basis (with patient authorization) for a minimum of 12 months after exam date.    Independent Review of: XRays this encounter    Diagnosis and Disposition:       Return precautions discussed and provided on AVS.Follow-up  Follow-up Information    [facility] [place]. Go in 3 days.    Specialty: Emergency Medicine  Why: If symptoms worsen  Contact information:  [address] [place] [address]  [phone]                Patient Instructions  .      You were seen at Urgent Care today for right ankle pain and swelling after   injury. Your xrays show no sign of fracture or dislocation.     Rest  Rest is required to allow your body to heal. Generally, you can resume   your routine activities when you are comfortable and have been given   permission by your health care provider.  Ice  Icing your injury helps to keep the swelling down  and it reduces pain. Do   not apply ice directly to your skin.  Put ice in a plastic bag.  Place a towel between your skin and the bag.  Leave the ice on for 20 minutes, 2-3 times per day.  Do this for as long as you are directed by  your health care provider.  Compression  Compression helps to keep swelling down, gives support, and helps with   discomfort. Compression may be done with an elastic bandage.  Elevation  Elevation helps to reduce swelling and it decreases pain. If possible,   your injured area should be placed at or above the level of your heart or   the center of your chest.    Please alternate the use of Tylenol 650 mg and ibuprofen 600 mg every 3   hours until your symptoms improve.    Your symptoms should begin to improve after 1 week of the aforementioned   measures. If they are not improving at all please call orthopedics for   further evaluation. Their phone number is: [phone].    Please return to urgent care or the emergency department if you develop   any new or worsening symptoms, including fever/chills, worsening pain,   worsening redness/swelling, new onset numbness/tingling, streaking up the   leg, or decreased range of motion of the affected extremity.               Final Diagnosis  Final diagnoses:   [S93.401A] Sprain of right ankle, unspecified ligament, initial encounter (Primary)         Wille Celeste, NP              Wille Celeste, NP  04/06/21 1640

## 2021-04-06 NOTE — ED Triage Notes (Signed)
Pt presents with right ankle pain that has been worsening since yesterday.  Pt was running in yard yesterday and rolled his ankle.  Pt is rated 10/10 currently. Pt has not tried any pharm or non pharm interventions.  No previous injuries to right ankle.

## 2021-06-04 ENCOUNTER — Ambulatory Visit
Admission: AD | Admit: 2021-06-04 | Discharge: 2021-06-04 | Disposition: A | Discharge status: Home / Self Care | Payer: Medicaid Other | Source: Ambulatory Visit | Attending: Cardiology | Admitting: Cardiology

## 2021-06-04 DIAGNOSIS — R42 Dizziness and giddiness: Secondary | ICD-10-CM | POA: Insufficient documentation

## 2021-06-04 DIAGNOSIS — R0789 Other chest pain: Secondary | ICD-10-CM

## 2021-06-04 DIAGNOSIS — R202 Paresthesia of skin: Secondary | ICD-10-CM

## 2021-06-04 DIAGNOSIS — H53143 Visual discomfort, bilateral: Secondary | ICD-10-CM

## 2021-06-04 DIAGNOSIS — Z789 Other specified health status: Secondary | ICD-10-CM | POA: Insufficient documentation

## 2021-06-04 DIAGNOSIS — R9431 Abnormal electrocardiogram [ECG] [EKG]: Secondary | ICD-10-CM

## 2021-06-04 LAB — POCT URINALYSIS DIPSTICK
Bilirubin,Ur: NEGATIVE
Blood,UA POCT: NEGATIVE
Glucose,UA POCT: NORMAL mg/dL
Ketones,UA POCT: NEGATIVE mg/dL
Leuk Esterase,UA POCT: NEGATIVE
Lot #: 58586502
Nitrite,UA POCT: NEGATIVE
PH,UA POCT: 5 (ref 5–8)
Protein,UA POCT: NEGATIVE mg/dL
Specific gravity,UA POCT: 1.02 (ref 1.002–1.030)
Urobilinogen,UA: NORMAL mg/dL

## 2021-06-04 LAB — POCT GLUCOSE: Glucose POCT: 92 mg/dL (ref 60–99)

## 2021-06-04 NOTE — Discharge Instructions (Signed)
Your EKG and neurological exam are reassuring.  Please read attached educational material on diagnosis of dizziness.  Please seek immediate further evaluation with any recurrent dizziness associated with headache, visual disturbance, chest pain, or numbness, tingling or weakness of extremities.

## 2021-06-04 NOTE — UC Provider Note (Signed)
History     Chief Complaint   Patient presents with    Dizziness     40 year old male with a past medical history significant for prior tobacco use and hypertension presents to urgent care for further evaluation of dizziness.  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  Patient states that he felt groggy and faint.  He endorses chest tightness, blurred vision and numbness and tingling of 3 middle fingers of right hand.  He denies any associated headache or extremity weakness.  No nausea or vomiting.  Symptoms lasted for approximately 1 hour.  Patient does endorse a history of hypertension however has not taken his blood pressure medications in several months.  He also endorses a history of sleep apnea and anxiety.  Patient does admit to increased stress and anxiety while at work today during the dinner rash immediately prior to this episode occurring.  Patient denies any prior history of TIA or CVA.  He does admit to drinking alcohol and smoking marijuana.  He did smoke marijuana with a friend yesterday and did feel a little strange following. Patient denies any prior covid infection. He has not been vaccinated. No recent head, neck or back injury. No history of diabetes. No associated urinary symptoms or recent GI illnesses.       History provided by:  Patient  Language interpreter used: No        Medical/Surgical/Family History     History reviewed. No pertinent past medical history.     There is no problem list on file for this patient.           Past Surgical History:   Procedure Laterality Date    arm surgery      right    SHOULDER SURGERY      left     Family History   Problem Relation Age of Onset    Cancer Mother         breast           Social History     Tobacco Use    Smoking status: Former Smoker     Quit date: 10/24/2008     Years since quitting: 12.6    Smokeless tobacco: Never Used   Substance Use Topics    Alcohol use: Yes     Alcohol/week: 0.8  standard drinks     Types: 1 Standard drinks or equivalent per week     Comment: about 1 glass of hennessy per week    Drug use: Yes     Types: Marijuana     Living Situation     Questions Responses    Patient lives with Alone    Homeless No    Caregiver for other family member No    External Services None    Employment Employed    Domestic Violence Risk                 Review of Systems   Review of Systems   Constitutional: Positive for fatigue. Negative for activity change, appetite change, chills, diaphoresis and fever.   HENT: Negative for congestion, ear discharge, ear pain and tinnitus.    Eyes: Positive for visual disturbance. Negative for photophobia.   Respiratory: Negative for cough, chest tightness and shortness of breath.    Cardiovascular: Positive for chest pain. Negative for palpitations and leg swelling.   Gastrointestinal: Negative for abdominal pain, nausea and vomiting.   Genitourinary:  Negative for decreased urine volume and dysuria.   Musculoskeletal: Negative for myalgias, neck pain and neck stiffness.   Skin: Negative for pallor, rash and wound.   Allergic/Immunologic: Negative for immunocompromised state.   Neurological: Positive for dizziness and numbness. Negative for tremors, seizures, syncope, facial asymmetry, speech difficulty, weakness, light-headedness and headaches.   Psychiatric/Behavioral: Negative for confusion and sleep disturbance.       Physical Exam   Triage Vitals  Triage Start: Start, (06/04/21 1914)   First Recorded BP: (!) 158/95, Resp: 16, Temp: 36 C (96.8 F), Temp src: TEMPORAL Oxygen Therapy SpO2: 97 %, Oximetry Source: Rt Hand, O2 Device: None (Room air), Heart Rate: 95, (06/04/21 1918)  .      Physical Exam  Vitals and nursing note reviewed.   Constitutional:       General: He is not in acute distress.     Appearance: Normal appearance. He is not ill-appearing or diaphoretic.   HENT:      Head: Normocephalic.      Right Ear: Tympanic membrane normal.      Left Ear:  Tympanic membrane normal.      Nose: Nose normal.      Mouth/Throat:      Mouth: Mucous membranes are moist.   Eyes:      Extraocular Movements: Extraocular movements intact.      Right eye: Normal extraocular motion and no nystagmus.      Left eye: Normal extraocular motion and no nystagmus.      Conjunctiva/sclera: Conjunctivae normal.      Pupils: Pupils are equal, round, and reactive to light.      Comments: PERRL, no abnormal EOM or nystagmus    Neck:      Comments: No tenderness of cervical spine   Cardiovascular:      Rate and Rhythm: Normal rate and regular rhythm.      Heart sounds: Normal heart sounds, S1 normal and S2 normal.   Pulmonary:      Effort: Pulmonary effort is normal.      Breath sounds: Normal breath sounds.   Musculoskeletal:         General: Normal range of motion.      Cervical back: Normal range of motion and neck supple. No rigidity or tenderness.      Right lower leg: No edema.      Left lower leg: No edema.   Skin:     General: Skin is warm.      Coloration: Skin is not pale.      Findings: No rash.   Neurological:      General: No focal deficit present.      Mental Status: He is alert and oriented to person, place, and time.      GCS: GCS eye subscore is 4. GCS verbal subscore is 5. GCS motor subscore is 6.      Cranial Nerves: No cranial nerve deficit or facial asymmetry.      Sensory: No sensory deficit.      Motor: No weakness, tremor or pronator drift.      Coordination: Coordination normal. Finger-Nose-Finger Test and Heel to Shin Test normal.      Gait: Gait and tandem walk normal.      Comments: Patient A&O x3, no focal deficits noted on exam    Psychiatric:         Attention and Perception: Attention normal.         Mood and Affect: Mood normal.  Speech: Speech normal.         Behavior: Behavior is cooperative.          Medical Decision Making        Medical Decision Making  Assessment:    40 year old male presents with episode of dizziness and chest tightness which  lasted approximately 1 hour.  Patient reports overall improvement in symptoms since arrival to urgent care.  EKG reveals T wave inversions in anterolateral leads which are unchanged compared to prior EKGs in 2021.  UA without evidence of infection or dehydration.  POCT glucose stable.  Neuro exam reassuring.     Differential diagnosis:    Anxiety, ACS, TIA, CVA, Covid, side effect of marijuana, radiculopathy     Plan and Results:    Discussed potential differential diagnoses as listed above.  Presently, patient is neurologically intact with no focal deficits noted.  EKG unchanged.  Vital signs stable.  Patient appears well. recommended continue close monitoring of symptoms and further work-up and evaluation in emergency department with any reoccurrence of symptoms.  Patient verbalizes understanding and is in agreement with this plan        EKG Interpretation:    EKS reveals T wave inversions in anterolateral leads. Unchanged when compared to prior EKGs in 2021. Reviewed with Dr. Nicolasa Ducking       Diagnosis and Disposition:       Discharge Medications  Current Discharge Medication List          Follow-up  Follow-up Information    [facility] Emergency Department.    Specialty: Emergency Medicine  Why: If symptoms worsen  Contact information:  [address]  [place] [address]  [phone]        [facility] [place].    Specialty: Emergency Medicine  Why: As needed  Contact information:  [address] [place] [address]  [phone]                Patient Instructions  .      Your EKG and neurological exam are reassuring.  Please read attached   educational material on diagnosis of dizziness.  Please seek immediate   further evaluation with any recurrent dizziness associated with headache,   visual disturbance, chest pain, or numbness, tingling or weakness of   extremities.               Final Diagnosis  Final diagnoses:   [R42] Dizziness (Primary)         Artelia Laroche, NP              Artelia Laroche, NP  06/04/21 2052

## 2021-06-04 NOTE — ED Triage Notes (Signed)
At Work and  felt like he was going to faint and vision was blurry with dizziness. This happened an hr ago. Chest feels a little tight. Hx; HTN. Was on a BP med but has not taking the med for a while, ran out of it.

## 2021-06-05 LAB — EKG 12-LEAD
P: 56 deg
PR: 134 ms
QRS: 7 deg
QRSD: 79 ms
QT: 360 ms
QTc: 424 ms
Rate: 83 {beats}/min
T: 8 deg

## 2022-03-11 ENCOUNTER — Other Ambulatory Visit: Payer: Self-pay

## 2022-03-11 LAB — CBC AND DIFFERENTIAL
Baso # K/uL: 0.1 10*3/uL (ref 0.0–0.2)
Basophil %: 1 % (ref 0–3)
Eos # K/uL: 0.2 10*3/uL (ref 0.0–0.6)
Eosinophil %: 2 % (ref 0–5)
Hematocrit: 48 % (ref 40–52)
Hemoglobin: 15.1 g/dL (ref 13.0–18.0)
Lymph # K/uL: 2.4 10*3/uL (ref 1.0–4.8)
Lymphocyte %: 28 % (ref 15–45)
MCH: 27 pg (ref 26.0–34.0)
MCHC: 31.5 g/dL — ABNORMAL LOW (ref 32.0–37.5)
MCV: 86 fL (ref 80–100)
Mono # K/uL: 0.7 10*3/uL (ref 0.1–1.0)
Monocyte %: 8 % (ref 0–15)
Neut # K/uL: 5.2 10*3/uL (ref 1.8–8.0)
RBC: 5.6 10*6/uL (ref 4.40–6.20)
RDW: 13.9 % (ref 0.0–15.2)
Seg Neut %: 60 % (ref 45–75)
WBC: 8.6 10*3/uL (ref 4.0–11.0)

## 2022-03-11 LAB — COMPREHENSIVE METABOLIC PANEL
ALT: 30 U/L (ref 10–49)
AST: 24 U/L (ref 7–37)
Albumin: 4.9 g/dL — ABNORMAL HIGH (ref 3.2–4.8)
Alk Phos: 106 U/L (ref 46–116)
Anion Gap: 5 meq/L (ref 4–16)
Bilirubin,Total: 0.2 mg/dL — ABNORMAL LOW (ref 0.3–1.2)
CO2: 30 meq/L (ref 22–30)
Calcium: 9.3 mg/dL (ref 8.5–10.2)
Chloride: 105 meq/L (ref 98–108)
Creatinine: 1 mg/dL (ref 0.7–1.2)
Globulin: 2.5 g/dL (ref 2.4–4.3)
Glucose: 105 mg/dL — ABNORMAL HIGH (ref 65–100)
Lab: 14 mg/dL (ref 8–20)
Potassium: 4.9 meq/L (ref 3.5–5.1)
Sodium: 140 meq/L (ref 135–145)
Total Protein: 7.4 g/dL (ref 5.7–8.2)

## 2022-03-11 LAB — UNMAPPED LAB RESULTS
Basophil # (HT): 0.1 10 3/uL (ref 0.0–0.2)
Basophil % (HT): 1 % (ref 0–3)
Eosinophil # (HT): 0.2 10 3/uL (ref 0.0–0.6)
Eosinophil % (HT): 2 % (ref 0–5)
Hematocrit (HT): 48 % (ref 40–52)
Hemoglobin (HGB) (HT): 15.1 g/dL (ref 13.0–18.0)
Lymphocyte # (HT): 2.4 10 3/uL (ref 1.0–4.8)
Lymphocyte % (HT): 28 % (ref 15–45)
MCHC (HT): 31.5 g/dL — ABNORMAL LOW (ref 32.0–37.5)
MCV (HT): 86 fL (ref 80–100)
Mean Corpuscular Hemoglobin (MCH) (HT): 27 pg (ref 26.0–34.0)
Monocyte # (HT): 0.7 10 3/uL (ref 0.1–1.0)
Monocyte % (HT): 8 % (ref 0–15)
Neutrophil # (HT): 5.2 10 3/uL (ref 1.8–8.0)
RBC (HT): 5.6 10 6/uL (ref 4.40–6.20)
RDW (HT): 13.9 % (ref 0.0–15.2)
Seg Neut % (HT): 60 % (ref 45–75)
WBC (HT): 8.6 10 3/uL (ref 4.0–11.0)

## 2022-03-11 LAB — URINALYSIS REFLEX TO CULTURE
Blood,UA: NEGATIVE
Glucose, Ur: NEGATIVE mg/dL
Ketones, UA: NEGATIVE mg/dL
Leuk Esterase,UA: NEGATIVE
Nitrite,UA: NEGATIVE
PH,Ur: 7.5 (ref 5.0–8.0)
Specific Gravity,UA: 1.018 (ref 1.005–1.030)

## 2022-03-11 LAB — EGFR CKD-EPI REFIT: eCFR CKD-EPI REFIT: >60 mL/min

## 2022-06-27 ENCOUNTER — Emergency Department: Payer: Medicaid Other | Admitting: Radiology

## 2022-06-27 ENCOUNTER — Encounter: Payer: Self-pay | Admitting: Emergency Medicine

## 2022-06-27 ENCOUNTER — Emergency Department
Admission: EM | Admit: 2022-06-27 | Discharge: 2022-06-27 | Disposition: A | Discharge status: Home / Self Care | Payer: Medicaid Other | Source: Ambulatory Visit | Attending: Emergency Medicine | Admitting: Emergency Medicine

## 2022-06-27 ENCOUNTER — Other Ambulatory Visit: Payer: Self-pay

## 2022-06-27 DIAGNOSIS — Z789 Other specified health status: Secondary | ICD-10-CM

## 2022-06-27 DIAGNOSIS — Z87891 Personal history of nicotine dependence: Secondary | ICD-10-CM

## 2022-06-27 DIAGNOSIS — I10 Essential (primary) hypertension: Secondary | ICD-10-CM | POA: Insufficient documentation

## 2022-06-27 DIAGNOSIS — R079 Chest pain, unspecified: Secondary | ICD-10-CM

## 2022-06-27 DIAGNOSIS — R9431 Abnormal electrocardiogram [ECG] [EKG]: Secondary | ICD-10-CM

## 2022-06-27 DIAGNOSIS — F1910 Other psychoactive substance abuse, uncomplicated: Secondary | ICD-10-CM | POA: Insufficient documentation

## 2022-06-27 DIAGNOSIS — R0789 Other chest pain: Secondary | ICD-10-CM

## 2022-06-27 LAB — CBC AND DIFFERENTIAL
Baso # K/uL: 0.1 10*3/uL (ref 0.0–0.1)
Basophil %: 0.6 %
Eos # K/uL: 0.3 10*3/uL (ref 0.0–0.5)
Eosinophil %: 3 %
Hematocrit: 47 % (ref 40–51)
Hemoglobin: 15.2 g/dL (ref 13.7–17.5)
IMM Granulocytes #: 0 10*3/uL (ref 0.0–0.0)
IMM Granulocytes: 0.3 %
Lymph # K/uL: 2.1 10*3/uL (ref 1.3–3.6)
Lymphocyte %: 24.9 %
MCH: 27 pg (ref 26–32)
MCHC: 32 g/dL (ref 32–37)
MCV: 85 fL (ref 79–92)
Mono # K/uL: 0.7 10*3/uL (ref 0.3–0.8)
Monocyte %: 8.5 %
Neut # K/uL: 5.4 10*3/uL (ref 1.8–5.4)
Nucl RBC # K/uL: 0 10*3/uL (ref 0.0–0.0)
Nucl RBC %: 0 /100 WBC (ref 0.0–0.2)
Platelets: 309 10*3/uL (ref 150–330)
RBC: 5.5 MIL/uL (ref 4.6–6.1)
RDW: 14.5 % — ABNORMAL HIGH (ref 11.6–14.4)
Seg Neut %: 62.7 %
WBC: 8.6 10*3/uL (ref 4.2–9.1)

## 2022-06-27 LAB — BASIC METABOLIC PANEL
Anion Gap: 11 (ref 7–16)
CO2: 27 mmol/L (ref 20–28)
Calcium: 9.6 mg/dL (ref 9.0–10.3)
Chloride: 102 mmol/L (ref 96–108)
Creatinine: 0.97 mg/dL (ref 0.67–1.17)
Glucose: 100 mg/dL — ABNORMAL HIGH (ref 60–99)
Lab: 11 mg/dL (ref 6–20)
Potassium: 4.8 mmol/L (ref 3.3–5.1)
Sodium: 140 mmol/L (ref 133–145)
eGFR BY CREAT: 100 *

## 2022-06-27 LAB — RUQ PANEL (ED ONLY)
ALT: 27 U/L (ref 0–50)
AST: 30 U/L (ref 0–50)
Albumin: 4.4 g/dL (ref 3.5–5.2)
Alk Phos: 112 U/L (ref 40–130)
Amylase: 68 U/L (ref 28–100)
Bilirubin,Direct: <0.2 mg/dL (ref 0.0–0.3)
Bilirubin,Total: <0.2 mg/dL (ref 0.0–1.2)
Lipase: 43 U/L (ref 13–60)
Total Protein: 7.1 g/dL (ref 6.3–7.7)

## 2022-06-27 LAB — TROPONIN T 0 HR HIGH SENSITIVITY (IP/ED ONLY): TROP T 0 HR High Sensitivity: <6 ng/L (ref 0–11)

## 2022-06-27 MED ORDER — AMLODIPINE BESYLATE 2.5 MG PO TABS *I*
2.5000 mg | ORAL_TABLET | Freq: Every day | ORAL | 0 refills | Status: AC
Start: 2022-06-27 — End: 2022-07-27

## 2022-06-27 MED ORDER — DEXTROSE 5 % FLUSH FOR PUMPS *I*
0.0000 mL/h | INTRAVENOUS | Status: DC | PRN
Start: 2022-06-27 — End: 2022-06-28

## 2022-06-27 MED ORDER — SODIUM CHLORIDE 0.9 % FLUSH FOR PUMPS *I*
0.0000 mL/h | INTRAVENOUS | Status: DC | PRN
Start: 2022-06-27 — End: 2022-06-28

## 2022-06-27 MED ORDER — AMLODIPINE BESYLATE 5 MG PO TABS *I*
5.0000 mg | ORAL_TABLET | Freq: Once | ORAL | Status: AC
Start: 2022-06-27 — End: 2022-06-27
  Administered 2022-06-27: 5 mg via ORAL
  Filled 2022-06-27: qty 1

## 2022-06-27 NOTE — Discharge Instructions (Addendum)
Restart amlodipine 2.5 mg daily.  A 30-day prescription was sent to your pharmacy    Be sure to establish an appointment with a primary care doctor, a referral was placed    Today's testing was otherwise reassuring.      Return to Emergency Department if you experience worsening or changing symptoms.    Follow-up with your primary care doctor if symptoms persist.    If you were given narcotic pain medication today or any medication that made you feel drowsy, do not drive or operate machinery for 24 hours.

## 2022-06-27 NOTE — ED Triage Notes (Signed)
Pt to the ED with intermittent chest painthat began a week ago. Pt also states that "my eyes have been watery". Endorses seeing floaters in his eyes. Denies any other symptoms at this time. No known sick contacts. Endorses some SOB with the episodes of chest pain.

## 2022-06-27 NOTE — ED Provider Notes (Signed)
History     Chief Complaint   Patient presents with   ? Chest Pain     CC:  Chest pain      HPI:    41 year old male, with history of hypertension presents with chest pain.  He states it has been intermittent for the past 1 week.  He describes it as a tightness.  It is been fairly randomly occurring, sometimes at rest.  He states he has had symptoms like this in the past, and wonders if he is having these because he has been off of his amlodipine.  He states that he is looking for a new PCP.  He denies any cocaine use.  He does use marijuana.      History provided by:  Patient        Medical/Surgical/Family History     No past medical history on file.     There is no problem list on file for this patient.           Past Surgical History:   Procedure Laterality Date   ? arm surgery      right   ? SHOULDER SURGERY      left     Family History   Problem Relation Age of Onset   ? Cancer Mother         breast           Social History     Tobacco Use   ? Smoking status: Former     Types: Cigarettes     Quit date: 10/24/2008     Years since quitting: 13.6   ? Smokeless tobacco: Never   Substance Use Topics   ? Alcohol use: Yes     Alcohol/week: 0.8 standard drinks of alcohol     Types: 1 Standard drinks or equivalent per week     Comment: about 1 glass of hennessy per week   ? Drug use: Yes     Types: Marijuana     Living Situation     Questions Responses    Patient lives with Alone    Homeless No    Caregiver for other family member No    External Services None    Employment Employed    Domestic Violence Risk                 Review of Systems   Review of Systems   Respiratory: Positive for chest tightness. Negative for shortness of breath.    Cardiovascular: Negative for palpitations.   Gastrointestinal: Negative for abdominal pain.       Physical Exam     Triage Vitals  Triage Start: Start, (06/27/22 1521)  First Recorded BP: (!) 169/118, Resp: 18, Temp: 36.5 ?C (97.7 ?F), Temp src: Infrared Oxygen Therapy SpO2: 99  %, Oximetry Source: Rt Hand, O2 Device: None (Room air), Heart Rate: 92, (06/27/22 1523) Heart Rate (via Pulse Ox): 90, (06/27/22 1523).  First Pain Reported  0-10 Scale: 8, Pain Location/Orientation: Chest, (06/27/22 1525)       Physical Exam  Vitals reviewed.   Constitutional:       Appearance: He is well-developed.   HENT:      Head: Normocephalic.      Nose: Nose normal.      Mouth/Throat:      Mouth: Mucous membranes are moist.   Eyes:      Pupils: Pupils are equal, round, and reactive to light.   Cardiovascular:  Rate and Rhythm: Normal rate and regular rhythm.      Pulses:           Radial pulses are 2+ on the right side and 2+ on the left side.        Posterior tibial pulses are 2+ on the right side and 2+ on the left side.      Heart sounds: No murmur heard.  Pulmonary:      Effort: Pulmonary effort is normal.      Breath sounds: Normal breath sounds.   Chest:      Chest wall: Tenderness (Left anterior chest, which reproduces his pain) present.   Abdominal:      General: There is no distension.      Palpations: Abdomen is soft.      Tenderness: There is no abdominal tenderness.   Musculoskeletal:         General: Normal range of motion.      Cervical back: Normal range of motion and neck supple.   Skin:     General: Skin is warm.   Neurological:      General: No focal deficit present.      Mental Status: He is alert.   Psychiatric:         Mood and Affect: Mood normal.         Medical Decision Making   Patient seen by me on:  06/27/2022    Assessment:  Fred King is a 41 y.o. male here with atypical chest pain.  There is reproducibility with palpation.  He is hypertensive, but states that he has not been taking his amlodipine.    Differential diagnosis:  Musculoskeletal chest pain  Rule out ACS  Do not suspect aortic dissection  Chronic hypertension likely due to nonadherence with medications  Doubt pneumothorax  Do not see signs of PE    Plan:  EKG, chest x-ray, labs.  We will treat his  hypertension with his usual dose of amlodipine      Orders Placed This Encounter      *Chest standard frontal and lateral views      CBC and differential      Basic metabolic panel      Troponin T 0 HR High Sensitivity      Troponin T 1 HR W/ Delta High Sensitivity      Troponin T 3 HR W/ Delta High Sensitivity      RUQ panel      EKG: initial      EKG: follow up      Insert peripheral IV        EKG Interpretation: Normal sinus rhythm, T wave inversions primarily in lateral leads similar to previous EKG, tracing reviewed by myself    Review of existing & external labs / records: Negative stress echo in April 2021    Independent interpretation of imaging: CXR:  NAD    Consideration of hospitalization: Not indicated given reassuring work-up      *Chest standard frontal and lateral views   Final Result      No evidence of acute disease in the chest.      END OF IMPRESSION                  [facility] submits this DICOM format image data and final report to the [facility], an independent secure electronic health information exchange, on a reciprocally searchable basis (with patient authorization) for a minimum of 12 months after exam  date.                Lab results: 06/27/22  1914   WBC 8.6   Hemoglobin 15.2   Hematocrit 47   RBC 5.5   Platelets 309   Seg Neut % 62.7   Lymphocyte % 24.9   Monocyte % 8.5   Eosinophil % 3.0   Basophil % 0.6           Lab results: 06/27/22  1914 03/11/22  1156   Sodium 140 140   Potassium 4.8 4.9   Chloride 102 105   CO2 27 30   UN 11 14   Creatinine 0.97 1.0   Glucose 100* 105*   Calcium 9.6 9.3   Total Protein 7.1 7.4   Albumin 4.4 4.9*   ALT 27 30   AST 30 24   Alk Phos 112 106   Bilirubin,Total <0.2 0.2*       Recent Labs   Lab 06/27/22  1914   Amylase 68   Lipase 43   Total Protein 7.1   Albumin 4.4   Bilirubin,Total <0.2   Bilirubin,Direct <0.2   Alk Phos 112   AST 30   ALT 27           Lab results: 06/27/22  1914   TROP T 0 HR High Sensitivity <6         06/27/2022 8:42 PM:.    Work-up is reassuring including high-sensitivity troponin.  We will plan to represcribe his amlodipine which she has not been taking.  We will also place referral for him to obtain a new PCP.  He is otherwise stable and ready for discharge        Dx:  Chest pain            Timoteo Ace, MD             Timoteo Ace, MD  06/27/22 2042

## 2022-06-30 NOTE — Progress Notes (Signed)
Received an inquiry that this patient is interested in becoming a new patient at [facility].  Attempted to contact the patient and left a voicemail for patient to return our call if they're still interested in becoming a patient.  Phone # [phone]

## 2022-07-02 LAB — EKG 12-LEAD
P: -5 deg
PR: 148 ms
QRS: 5 deg
QRSD: 80 ms
QT: 368 ms
QTc: 423 ms
Rate: 80 {beats}/min
T: -20 deg

## 2022-07-05 ENCOUNTER — Other Ambulatory Visit: Payer: Self-pay

## 2022-07-05 LAB — UNMAPPED LAB RESULTS
Basophil # (HT): 0.1 10 3/uL (ref 0.0–0.2)
Basophil % (HT): 0 % (ref 0–3)
Eosinophil # (HT): 0.1 10 3/uL (ref 0.0–0.6)
Eosinophil % (HT): 1 % (ref 0–5)
Hematocrit (HT): 47 % (ref 40–52)
Hemoglobin (HGB) (HT): 15.6 g/dL (ref 13.0–18.0)
Lymphocyte # (HT): 1.7 10 3/uL (ref 1.0–4.8)
Lymphocyte % (HT): 15 % (ref 15–45)
MCHC (HT): 32.9 g/dL (ref 32.0–37.5)
MCV (HT): 81 fL (ref 80–100)
Mean Corpuscular Hemoglobin (MCH) (HT): 26.8 pg (ref 26.0–34.0)
Monocyte # (HT): 0.8 10 3/uL (ref 0.1–1.0)
Monocyte % (HT): 7 % (ref 0–15)
Neutrophil # (HT): 8.6 10 3/uL — ABNORMAL HIGH (ref 1.8–8.0)
Platelets (HT): 301 10 3/uL (ref 150–450)
RBC (HT): 5.83 10 6/uL (ref 4.40–6.20)
RDW (HT): 14.3 % (ref 0.0–15.2)
Seg Neut % (HT): 76 % — ABNORMAL HIGH (ref 45–75)
WBC (HT): 11.4 10 3/uL — ABNORMAL HIGH (ref 4.0–11.0)

## 2022-07-05 LAB — CBC AND DIFFERENTIAL
Baso # K/uL: 0.1 10*3/uL (ref 0.0–0.2)
Basophil %: 0 % (ref 0–3)
Eos # K/uL: 0.1 10*3/uL (ref 0.0–0.6)
Eosinophil %: 1 % (ref 0–5)
Hematocrit: 47 % (ref 40–52)
Hemoglobin: 15.6 g/dL (ref 13.0–18.0)
Lymph # K/uL: 1.7 10*3/uL (ref 1.0–4.8)
Lymphocyte %: 15 % (ref 15–45)
MCH: 26.8 pg (ref 26.0–34.0)
MCHC: 32.9 g/dL (ref 32.0–37.5)
MCV: 81 fL (ref 80–100)
Mono # K/uL: 0.8 10*3/uL (ref 0.1–1.0)
Monocyte %: 7 % (ref 0–15)
Neut # K/uL: 8.6 10*3/uL — ABNORMAL HIGH (ref 1.8–8.0)
Platelets: 301 10*3/uL (ref 150–450)
RBC: 5.83 10*6/uL (ref 4.40–6.20)
RDW: 14.3 % (ref 0.0–15.2)
Seg Neut %: 76 % — ABNORMAL HIGH (ref 45–75)
WBC: 11.4 10*3/uL — ABNORMAL HIGH (ref 4.0–11.0)

## 2022-07-05 LAB — TROPONIN I, HS, 0 HR: TROPONIN I, HS, 0 HR: 8 pg/mL (ref 0–75)

## 2022-07-05 LAB — COMPREHENSIVE METABOLIC PANEL
ALT: 42 U/L (ref 10–49)
AST: 39 U/L — ABNORMAL HIGH (ref 7–37)
Albumin: 4.9 g/dL — ABNORMAL HIGH (ref 3.2–4.8)
Alk Phos: 107 U/L (ref 46–116)
Anion Gap: 12 mmol/L (ref 4–16)
Bilirubin,Total: 0.6 mg/dL (ref 0.3–1.2)
CO2: 28 meq/L (ref 22–30)
Calcium: 10.1 mg/dL (ref 8.5–10.2)
Chloride: 98 mmol/L (ref 98–108)
Creatinine: 1 mg/dL (ref 0.7–1.2)
Globulin: 2.7 g/dL (ref 2.4–4.3)
Glucose: 95 mg/dL (ref 65–100)
Lab: 10 mg/dL (ref 8–20)
Potassium: 4.3 mmol/L (ref 3.5–5.1)
Sodium: 138 mmol/L (ref 135–145)
Total Protein: 7.6 g/dL (ref 5.7–8.2)

## 2022-07-05 LAB — EGFR CKD-EPI REFIT: eCFR CKD-EPI REFIT: >60 mL/min

## 2022-07-05 LAB — TROPONIN I, HIGH SENS., 1 HR
TROPI, HS, DELTA 0-1 HR: -1 pg/mL — ABNORMAL LOW (ref 0–14)
TROPONIN I, HS. 1 HR: 7 pg/mL (ref 0–75)

## 2022-07-06 LAB — UNMAPPED LAB RESULTS
Basophil # (HT): 0.1 10 3/uL (ref 0.0–0.2)
Basophil % (HT): 1 % (ref 0–3)
Eosinophil # (HT): 0.4 10 3/uL (ref 0.0–0.6)
Eosinophil % (HT): 4 % (ref 0–5)
Hematocrit (HT): 44 % (ref 40–52)
Hemoglobin (HGB) (HT): 14.1 g/dL (ref 13.0–18.0)
Lymphocyte # (HT): 2.8 10 3/uL (ref 1.0–4.8)
Lymphocyte % (HT): 30 % (ref 15–45)
MCHC (HT): 32.3 g/dL (ref 32.0–37.5)
MCV (HT): 84 fL (ref 80–100)
Mean Corpuscular Hemoglobin (MCH) (HT): 27 pg (ref 26.0–34.0)
Monocyte # (HT): 0.9 10 3/uL (ref 0.1–1.0)
Monocyte % (HT): 10 % (ref 0–15)
Neutrophil # (HT): 5.2 10 3/uL (ref 1.8–8.0)
Platelets (HT): 292 10 3/uL (ref 150–450)
RBC (HT): 5.22 10 6/uL (ref 4.40–6.20)
RDW (HT): 14.6 % (ref 0.0–15.2)
Seg Neut % (HT): 56 % (ref 45–75)
WBC (HT): 9.3 10 3/uL (ref 4.0–11.0)

## 2023-01-17 ENCOUNTER — Emergency Department
Admission: EM | Admit: 2023-01-17 | Discharge: 2023-01-17 | Disposition: A | Discharge status: Home / Self Care | Payer: Medicaid Other | Source: Ambulatory Visit | Attending: Emergency Medicine | Admitting: Emergency Medicine

## 2023-01-17 ENCOUNTER — Encounter: Payer: Self-pay | Admitting: Geriatric Medicine

## 2023-01-17 ENCOUNTER — Other Ambulatory Visit: Payer: Self-pay

## 2023-01-17 DIAGNOSIS — K047 Periapical abscess without sinus: Secondary | ICD-10-CM | POA: Insufficient documentation

## 2023-01-17 DIAGNOSIS — K0889 Other specified disorders of teeth and supporting structures: Secondary | ICD-10-CM

## 2023-01-17 DIAGNOSIS — Z87891 Personal history of nicotine dependence: Secondary | ICD-10-CM | POA: Insufficient documentation

## 2023-01-17 DIAGNOSIS — I10 Essential (primary) hypertension: Secondary | ICD-10-CM

## 2023-01-17 HISTORY — DX: Essential (primary) hypertension: I10

## 2023-01-17 MED ORDER — PENICILLIN V POTASSIUM 500 MG PO TABS *I*
500.0000 mg | ORAL_TABLET | Freq: Three times a day (TID) | ORAL | 0 refills | Status: AC
Start: 2023-01-17 — End: 2023-01-24

## 2023-01-17 NOTE — ED Triage Notes (Addendum)
Pt presents to ED with c/o "upcoming abscess" to left lower gum. Pt denies any recent dental procedures. Site appears mildly swollen and gray in color. No drainage apparent. Hx of abscesses in the same area. Hypertensive at triage.     Prehospital medications given: No

## 2023-01-17 NOTE — Discharge Instructions (Addendum)
Referral to [facility] next week follow up  Penicillin VK 500 mg 3 times daily x 7 days  Salt water rinses   Ibuprofen/ tylenol alternate    Follow up with MD for your blood pressure  Take your medications as directed

## 2023-01-17 NOTE — ED Provider Notes (Signed)
History     Chief Complaint   Patient presents with    Dental Pain     42 year old male complains of left lower dental pain. States 1 [place] ago in jail the dentist pulled his tooth and broke it.  Does not have a dentist.  Has not taken any medications.  No fevers or chills.  Able to swallow, no tongue swallowing.  Goes to [employer].    Has a history of hypertension and takes medication which he states he is compliant      History provided by:  Patient  Language interpreter used: No          Medical/Surgical/Family History     Past Medical History:   Diagnosis Date    Hypertension         Patient Active Problem List   Diagnosis Code    Hypertension I10    Substance abuse F19.10            Past Surgical History:   Procedure Laterality Date    arm surgery      right    SHOULDER SURGERY      left          Social History     Tobacco Use    Smoking status: Former     Types: Cigarettes     Quit date: 10/24/2008     Years since quitting: 14.2    Smokeless tobacco: Never    Tobacco comments:     Marijuana   Substance Use Topics    Alcohol use: Yes     Alcohol/week: 0.8 standard drinks of alcohol     Types: 1 Standard drinks or equivalent per week     Comment: about 1 glass of hennessy per week    Drug use: Yes     Types: Marijuana             Review of Systems   Constitutional:  Negative for chills and fever.   HENT:  Positive for dental problem. Negative for congestion, sinus pressure, sinus pain, sore throat, trouble swallowing and voice change.    Respiratory:  Negative for shortness of breath.    Cardiovascular:  Negative for chest pain and palpitations.   Neurological:  Negative for dizziness, weakness and headaches.       Physical Exam     Triage Vitals  Triage Start: Start, (01/17/23 1404)  First Recorded BP: (!) 183/114, Resp: 16, Temp: 35.8 C (96.4 F), Temp src: Infrared Oxygen Therapy SpO2: 93 %, O2 Device: None (Room air), Heart Rate: (!) 116, (01/17/23 1407)  .  First Pain Reported  0-10 Scale: 9, Pain  Location/Orientation: Mouth, (01/17/23 1407)       Physical Exam    Medical Decision Making     Assessment:  42 year old male present to the ED with complaint of Left lower gum swelling and discomfort    Differential diagnosis:  Dental abscess  Dental decay  Fractured tooth  HTN elevated    Plan:  Exam  Referral to [facility]  Penicillin VK 500 mg 3 times daily x 7 days  Salt water rinses   Ibuprofen/ tylenol alternate    ED Course and Disposition:  Referral to [facility]  Penicillin VK 500 mg 3 times daily x 7 days  Salt water rinses   Ibuprofen/ tylenol alternate  Repeat blood pressure  MD referral for PCP follow up with blood pressure  Take BP medications  Linward Natal Jordynne Mccown, PA            Lindell Noe, [place]  01/17/23 [phone]

## 2023-01-20 ENCOUNTER — Other Ambulatory Visit: Payer: Self-pay

## 2023-01-20 LAB — TROPONIN I, HIGH SENS., 1 HR: TROPONIN I, HS. 1 HR: <3 pg/mL (ref 0–75)

## 2023-01-20 LAB — CBC AND DIFFERENTIAL
Baso # K/uL: 0.1 10*3/uL (ref 0.0–0.2)
Basophil %: 1 % (ref 0–3)
Eos # K/uL: 0.1 10*3/uL (ref 0.0–0.6)
Eosinophil %: 1 % (ref 0–5)
Hematocrit: 44 % (ref 40–52)
Hemoglobin: 14.1 g/dL (ref 13.0–18.0)
Lymph # K/uL: 1.5 10*3/uL (ref 1.0–4.8)
Lymphocyte %: 16 % (ref 15–45)
MCH: 27.2 pg (ref 26.0–34.0)
MCHC: 31.8 g/dL — ABNORMAL LOW (ref 32.0–37.5)
MCV: 85 fL (ref 80–100)
Mono # K/uL: 0.9 10*3/uL (ref 0.1–1.0)
Monocyte %: 10 % (ref 0–15)
Neut # K/uL: 6.4 10*3/uL (ref 1.8–8.0)
Platelets: 291 10*3/uL (ref 150–450)
RBC: 5.19 10*6/uL (ref 4.40–6.20)
RDW: 14.3 % (ref 0.0–15.2)
Seg Neut %: 72 % (ref 45–75)
WBC: 8.9 10*3/uL (ref 4.0–11.0)

## 2023-01-20 LAB — UNMAPPED LAB RESULTS
Basophil # (HT): 0.1 10 3/uL (ref 0.0–0.2)
Basophil % (HT): 1 % (ref 0–3)
Eosinophil # (HT): 0.1 10 3/uL (ref 0.0–0.6)
Eosinophil % (HT): 1 % (ref 0–5)
Hematocrit (HT): 44 % (ref 40–52)
Hemoglobin (HGB) (HT): 14.1 g/dL (ref 13.0–18.0)
Lymphocyte # (HT): 1.5 10 3/uL (ref 1.0–4.8)
Lymphocyte % (HT): 16 % (ref 15–45)
MCHC (HT): 31.8 g/dL — ABNORMAL LOW (ref 32.0–37.5)
MCV (HT): 85 fL (ref 80–100)
Mean Corpuscular Hemoglobin (MCH) (HT): 27.2 pg (ref 26.0–34.0)
Monocyte # (HT): 0.9 10 3/uL (ref 0.1–1.0)
Monocyte % (HT): 10 % (ref 0–15)
Neutrophil # (HT): 6.4 10 3/uL (ref 1.8–8.0)
Platelets (HT): 291 10 3/uL (ref 150–450)
RBC (HT): 5.19 10 6/uL (ref 4.40–6.20)
RDW (HT): 14.3 % (ref 0.0–15.2)
Seg Neut % (HT): 72 % (ref 45–75)
WBC (HT): 8.9 10 3/uL (ref 4.0–11.0)

## 2023-01-20 LAB — BASIC METABOLIC PANEL
Anion Gap: 5 mmol/L (ref 4–16)
CO2: 29 meq/L (ref 22–30)
Calcium: 9.3 mg/dL (ref 8.5–10.2)
Chloride: 106 mmol/L (ref 98–108)
Creatinine: 1.2 mg/dL (ref 0.7–1.2)
Glucose: 156 mg/dL — ABNORMAL HIGH (ref 65–100)
Lab: 10 mg/dL (ref 8–20)
Potassium: 4.3 mmol/L (ref 3.5–5.1)
Sodium: 140 mmol/L (ref 135–145)

## 2023-01-20 LAB — TROPONIN I, HS, 0 HR: TROPONIN I, HS, 0 HR: <3 pg/mL (ref 0–75)

## 2023-01-20 LAB — INFLUENZA  A & B/RSV PCR
INFLUENZA B PCR: NOT DETECTED
Influenza A PCR: NOT DETECTED
RSV Rapid Antigen: NOT DETECTED

## 2023-01-20 LAB — COVID-19 NAAT (PCR): COVID-19 NAAT (PCR): NOT DETECTED

## 2023-01-20 LAB — HEPATIC FUNCTION PANEL
ALT: 27 U/L (ref 10–49)
AST: 20 U/L (ref 7–37)
Albumin: 4.4 g/dL (ref 3.2–4.8)
Alk Phos: 92 U/L (ref 46–116)
Bilirubin,Direct: <0.1 mg/dL (ref 0.0–0.3)
Bilirubin,Total: 0.3 mg/dL (ref 0.3–1.2)
Globulin: 2.3 g/dL — ABNORMAL LOW (ref 2.4–4.3)
Total Protein: 6.7 g/dL (ref 5.7–8.2)

## 2023-01-20 LAB — EGFR CKD-EPI REFIT: eCFR CKD-EPI REFIT: >60 mL/min

## 2023-01-20 LAB — LIPASE: Lipase: 37 U/L (ref 6–51)

## 2023-01-20 LAB — D-DIMER, QUANTITATIVE: D-Dimer: 320 ng{FEU}/mL (ref <500)

## 2023-07-17 ENCOUNTER — Other Ambulatory Visit: Payer: Self-pay

## 2023-07-17 ENCOUNTER — Emergency Department
Admission: EM | Admit: 2023-07-17 | Discharge: 2023-07-17 | Disposition: A | Discharge status: Home / Self Care | Payer: Self-pay | Source: Ambulatory Visit | Attending: Student in an Organized Health Care Education/Training Program | Admitting: Student in an Organized Health Care Education/Training Program

## 2023-07-17 DIAGNOSIS — G5601 Carpal tunnel syndrome, right upper limb: Secondary | ICD-10-CM | POA: Insufficient documentation

## 2023-07-17 DIAGNOSIS — K029 Dental caries, unspecified: Secondary | ICD-10-CM | POA: Insufficient documentation

## 2023-07-17 DIAGNOSIS — Z87891 Personal history of nicotine dependence: Secondary | ICD-10-CM | POA: Insufficient documentation

## 2023-07-17 DIAGNOSIS — M25531 Pain in right wrist: Secondary | ICD-10-CM

## 2023-07-17 MED ORDER — NAPROXEN 500 MG PO TABS *I*
500.0000 mg | ORAL_TABLET | Freq: Two times a day (BID) | ORAL | 0 refills | Status: AC
Start: 2023-07-17 — End: 2023-08-03
  Filled 2023-07-17: qty 28, 14d supply, fill #0

## 2023-07-17 MED ORDER — AMOXICILLIN 500 MG PO CAPS *I*
500.0000 mg | ORAL_CAPSULE | Freq: Three times a day (TID) | ORAL | 0 refills | Status: AC
Start: 2023-07-17 — End: 2023-07-27
  Filled 2023-07-17: qty 21, 7d supply, fill #0

## 2023-07-17 NOTE — ED Provider Notes (Signed)
History     Chief Complaint   Patient presents with    Wrist Pain    Dental Pain     42 year old male with past history of hypertension, former substance use, presenting for evaluation of dental pain, right wrist pain.  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  States he was told many years ago at a previous job that he was performing similar activities that he might have carpal tunnel syndrome.  Things seem to improve until recently.  Describes a shooting and aching pain in the thumb and index finger of the right hand.  He is right-hand dominant and this is the hand he uses to perform his job each day.  No trauma to the area.  No swelling or redness.  No punctures or abrasions.  States that he used to wear a wrist brace which seemed to help.  Also states he is having dental pain in his right mandibular canine.  Feels like it is swollen underneath it and painful to chew with.  Has not taken medication for it.  Does not currently have a dentist.      History provided by:  Patient and medical records        Medical/Surgical/Family History     Past Medical History:   Diagnosis Date    Hypertension         Patient Active Problem List   Diagnosis Code    Hypertension I10    Substance abuse F19.10    Chest pain R07.9    Colitis presumed infectious K52.9    Dehydration E86.0    Enteritis K52.9            Past Surgical History:   Procedure Laterality Date    arm surgery      right    SHOULDER SURGERY      left          Social History     Tobacco Use    Smoking status: Former     Types: Cigarettes     Quit date: 10/24/2008     Years since quitting: 14.7    Smokeless tobacco: Never    Tobacco comments:     Marijuana   Substance Use Topics    Alcohol use: Yes     Alcohol/week: 0.8 standard drinks of alcohol     Types: 1 Standard drinks or equivalent per week     Comment: about 1 glass of hennessy per week    Drug use: Yes     Types: Marijuana             Review of Systems    Physical Exam      Triage Vitals     First Recorded Temp: 36.8 C (98.2 F), Temp src: Infrared Oxygen Therapy SpO2: 98 %, Oximetry Source: Lt Hand, O2 Device: None (Room air), Heart Rate: 87, (07/17/23 1356)  .  First Pain Reported  0-10 Scale: 10, Pain Location/Orientation: Wrist Right;Teeth, (07/17/23 1353)       Physical Exam  Vitals and nursing note reviewed.   Constitutional:       General: He is not in acute distress.     Appearance: Normal appearance. He is not ill-appearing or toxic-appearing.   HENT:      Head: Normocephalic and atraumatic.      Mouth/Throat:      Mouth: Mucous membranes are moist.      Comments: Dental carry of tooth 27, no substantial gingival  reaction or swelling, soft and nonelevated floor of mouth, mobile tongue, clear speech, managing secretions without difficulty, no gingival abscess appreciated, tender to percussion  Eyes:      Conjunctiva/sclera: Conjunctivae normal.   Cardiovascular:      Rate and Rhythm: Normal rate and regular rhythm.   Pulmonary:      Effort: Pulmonary effort is normal.   Musculoskeletal:      Cervical back: Normal range of motion and neck supple.      Comments: Positive right wrist Phalen sign, positive Tinel's sign, full range of motion and motor function in the right hand and wrist, no erythema or swelling, scars over the volar right forearm from prior injury   Skin:     Capillary Refill: Capillary refill takes less than 2 seconds.   Neurological:      General: No focal deficit present.      Mental Status: He is alert and oriented to person, place, and time.   Psychiatric:         Mood and Affect: Mood normal.         Behavior: Behavior normal.         Medical Decision Making   Patient seen by me on:  07/17/2023    Assessment:  42 year old male with medical history as documented above presenting for dental pain, right wrist pain.  Vital signs reassuring on arrival.  He is well in appearance.  He has a positive Tinel's and Phalen's test to the right wrist which clearly  reproduces symptoms consistent with carpal tunnel syndrome.  This is likely chronic and recurrent in the setting of repetitive activity at work that he performs.  He was provided with a wrist splint for this and an orthopedic referral.  Encouraged him to wear the wrist splint, to see if his job will reallocate him to a new station to minimize the demands on his right hand/wrist, and I placed an orthopedic referral for the outpatient setting to consider surgical management if warranted.  As for the tooth, as shown above in the photo, he has an obvious dental carry of tooth #27.  There is no obvious gingival abscess that I can drain right now.  He has no substantial facial swelling or any signs of Ludwig's.  We discussed starting amoxicillin, outpatient follow-up with dentistry and I provided him the number for [facility] dental urgent care clinic which he will call today. We discussed return precautions, outpatient follow-up plan and he verbalized understanding.  He is comfortable with discharge             Charlene Brooke, MD            Charlene Brooke, MD  07/17/23 1414

## 2023-07-17 NOTE — Discharge Instructions (Addendum)
You were seen in the emergency room today for right wrist pain likely due to carpal tunnel syndrome.  This is likely due to the repetitive activity at work with your right hand and optimally you should switch to a different duty station to relieve the inflammation and injury to your nerve in your hand.  I placed a referral to orthopedic surgery as I believe this is likely carpal tunnel syndrome and would like you to meet with them in the outpatient setting.  Wearing the wrist splint may be helpful especially worn at night.  I prescribed naproxen to be used twice daily for pain control.  I also prescribed amoxicillin to be used for your dental pain likely from a cavity in your teeth.  Please call the [facility] dental clinic today to see if they can coordinate soon outpatient follow-up to treat your tooth.  Return with facial swelling, fever, difficulty moving your jaw, difficulty swallowing or breathing, or any additional new concerns.  You may return for reevaluation at any time.

## 2023-07-17 NOTE — ED Triage Notes (Signed)
Pt coming in with c/o right wrist pain. Endorses numbness in his right thumb. Pt also concerned he has an infection in one of his right bottom teeth.     Prehospital medications given: No

## 2023-07-20 ENCOUNTER — Other Ambulatory Visit: Payer: Self-pay

## 2024-04-15 ENCOUNTER — Other Ambulatory Visit: Payer: Self-pay

## 2024-04-15 LAB — INFLUENZA  A & B/RSV PCR
INFLUENZA B PCR: NOT DETECTED
Influenza A PCR: NOT DETECTED
RSV Rapid Antigen: NOT DETECTED

## 2024-04-15 LAB — COVID-19 NAAT (PCR): COVID-19 NAAT (PCR): NOT DETECTED

## 2024-06-15 ENCOUNTER — Other Ambulatory Visit: Payer: Self-pay

## 8387-07-05 DEATH — deceased
